# Patient Record
Sex: Female | Born: 1955 | Race: Black or African American | Hispanic: No | Smoking: Never smoker
Health system: Southern US, Community
[De-identification: ages and names within clinical notes are randomized; demographics above are authoritative.]

## PROBLEM LIST (undated history)

## (undated) DIAGNOSIS — I10 Essential (primary) hypertension: Secondary | ICD-10-CM

## (undated) DIAGNOSIS — K219 Gastro-esophageal reflux disease without esophagitis: Secondary | ICD-10-CM

## (undated) DIAGNOSIS — T7840XA Allergy, unspecified, initial encounter: Secondary | ICD-10-CM

## (undated) DIAGNOSIS — D649 Anemia, unspecified: Secondary | ICD-10-CM

## (undated) DIAGNOSIS — K56609 Unspecified intestinal obstruction, unspecified as to partial versus complete obstruction: Secondary | ICD-10-CM

## (undated) HISTORY — DX: Allergy, unspecified, initial encounter: T78.40XA

## (undated) HISTORY — DX: Anemia, unspecified: D64.9

## (undated) HISTORY — DX: Essential (primary) hypertension: I10

## (undated) HISTORY — PX: COLONOSCOPY: SHX174

## (undated) HISTORY — DX: Gastro-esophageal reflux disease without esophagitis: K21.9

---

## 1990-03-15 HISTORY — PX: HIATAL HERNIA REPAIR: SHX195

## 2000-05-19 ENCOUNTER — Other Ambulatory Visit: Admission: RE | Admit: 2000-05-19 | Discharge: 2000-05-19 | Payer: Self-pay | Admitting: Family Medicine

## 2001-06-14 ENCOUNTER — Other Ambulatory Visit: Admission: RE | Admit: 2001-06-14 | Discharge: 2001-06-14 | Payer: Self-pay | Admitting: Internal Medicine

## 2002-06-20 ENCOUNTER — Other Ambulatory Visit: Admission: RE | Admit: 2002-06-20 | Discharge: 2002-06-20 | Payer: Self-pay | Admitting: Internal Medicine

## 2002-07-03 ENCOUNTER — Encounter: Admission: RE | Admit: 2002-07-03 | Discharge: 2002-07-03 | Payer: Self-pay | Admitting: Family Medicine

## 2002-07-03 ENCOUNTER — Encounter: Payer: Self-pay | Admitting: Family Medicine

## 2003-07-15 ENCOUNTER — Other Ambulatory Visit: Admission: RE | Admit: 2003-07-15 | Discharge: 2003-07-15 | Payer: Self-pay | Admitting: Internal Medicine

## 2003-07-16 ENCOUNTER — Encounter: Admission: RE | Admit: 2003-07-16 | Discharge: 2003-07-16 | Payer: Self-pay | Admitting: Family Medicine

## 2004-05-28 ENCOUNTER — Ambulatory Visit: Payer: Self-pay | Admitting: Family Medicine

## 2004-06-08 ENCOUNTER — Ambulatory Visit: Payer: Self-pay | Admitting: Family Medicine

## 2004-07-15 ENCOUNTER — Ambulatory Visit: Payer: Self-pay | Admitting: Family Medicine

## 2004-08-27 ENCOUNTER — Ambulatory Visit (HOSPITAL_BASED_OUTPATIENT_CLINIC_OR_DEPARTMENT_OTHER): Admission: RE | Admit: 2004-08-27 | Discharge: 2004-08-27 | Payer: Self-pay | Admitting: Surgery

## 2004-08-27 ENCOUNTER — Encounter (INDEPENDENT_AMBULATORY_CARE_PROVIDER_SITE_OTHER): Payer: Self-pay | Admitting: *Deleted

## 2004-08-27 ENCOUNTER — Ambulatory Visit (HOSPITAL_COMMUNITY): Admission: RE | Admit: 2004-08-27 | Discharge: 2004-08-27 | Payer: Self-pay | Admitting: Surgery

## 2004-09-17 ENCOUNTER — Ambulatory Visit: Payer: Self-pay | Admitting: Family Medicine

## 2004-12-03 ENCOUNTER — Ambulatory Visit: Payer: Self-pay | Admitting: Family Medicine

## 2005-03-03 ENCOUNTER — Ambulatory Visit: Payer: Self-pay | Admitting: Family Medicine

## 2005-03-15 HISTORY — PX: CERVICAL FUSION: SHX112

## 2005-06-30 ENCOUNTER — Ambulatory Visit: Payer: Self-pay | Admitting: Family Medicine

## 2005-10-05 ENCOUNTER — Ambulatory Visit: Payer: Self-pay | Admitting: Family Medicine

## 2005-10-05 ENCOUNTER — Other Ambulatory Visit: Admission: RE | Admit: 2005-10-05 | Discharge: 2005-10-05 | Payer: Self-pay | Admitting: Family Medicine

## 2005-12-10 ENCOUNTER — Ambulatory Visit: Payer: Self-pay | Admitting: Family Medicine

## 2005-12-13 ENCOUNTER — Encounter: Admission: RE | Admit: 2005-12-13 | Discharge: 2005-12-13 | Payer: Self-pay | Admitting: Family Medicine

## 2005-12-19 ENCOUNTER — Encounter: Admission: RE | Admit: 2005-12-19 | Discharge: 2005-12-19 | Payer: Self-pay | Admitting: Internal Medicine

## 2005-12-20 ENCOUNTER — Observation Stay (HOSPITAL_COMMUNITY): Admission: EM | Admit: 2005-12-20 | Discharge: 2005-12-20 | Payer: Self-pay | Admitting: Emergency Medicine

## 2006-07-21 ENCOUNTER — Encounter: Payer: Self-pay | Admitting: Family Medicine

## 2006-07-21 ENCOUNTER — Ambulatory Visit: Payer: Self-pay | Admitting: Family Medicine

## 2006-07-21 DIAGNOSIS — M79609 Pain in unspecified limb: Secondary | ICD-10-CM

## 2006-07-21 DIAGNOSIS — I10 Essential (primary) hypertension: Secondary | ICD-10-CM | POA: Insufficient documentation

## 2006-07-21 DIAGNOSIS — D649 Anemia, unspecified: Secondary | ICD-10-CM | POA: Insufficient documentation

## 2006-08-17 ENCOUNTER — Ambulatory Visit: Payer: Self-pay | Admitting: Family Medicine

## 2006-08-17 DIAGNOSIS — J069 Acute upper respiratory infection, unspecified: Secondary | ICD-10-CM | POA: Insufficient documentation

## 2006-08-17 DIAGNOSIS — R42 Dizziness and giddiness: Secondary | ICD-10-CM | POA: Insufficient documentation

## 2006-08-25 ENCOUNTER — Encounter: Payer: Self-pay | Admitting: Family Medicine

## 2006-08-25 ENCOUNTER — Encounter (INDEPENDENT_AMBULATORY_CARE_PROVIDER_SITE_OTHER): Payer: Self-pay | Admitting: Internal Medicine

## 2006-09-03 ENCOUNTER — Encounter: Payer: Self-pay | Admitting: Family Medicine

## 2006-12-13 ENCOUNTER — Ambulatory Visit: Payer: Self-pay | Admitting: Family Medicine

## 2006-12-23 ENCOUNTER — Ambulatory Visit: Payer: Self-pay | Admitting: Family Medicine

## 2006-12-29 ENCOUNTER — Encounter (INDEPENDENT_AMBULATORY_CARE_PROVIDER_SITE_OTHER): Payer: Self-pay | Admitting: *Deleted

## 2006-12-29 LAB — CONVERTED CEMR LAB
AST: 19 units/L (ref 0–37)
Basophils Absolute: 0 10*3/uL (ref 0.0–0.1)
Basophils Relative: 1.1 % — ABNORMAL HIGH (ref 0.0–1.0)
Bilirubin, Direct: 0.1 mg/dL (ref 0.0–0.3)
CO2: 28 meq/L (ref 19–32)
Chloride: 110 meq/L (ref 96–112)
Cholesterol: 159 mg/dL (ref 0–200)
Creatinine, Ser: 0.8 mg/dL (ref 0.4–1.2)
Eosinophils Relative: 2.5 % (ref 0.0–5.0)
Glucose, Bld: 100 mg/dL — ABNORMAL HIGH (ref 70–99)
HCT: 35.4 % — ABNORMAL LOW (ref 36.0–46.0)
Hemoglobin: 11.9 g/dL — ABNORMAL LOW (ref 12.0–15.0)
LDL Cholesterol: 97 mg/dL (ref 0–99)
MCHC: 33.7 g/dL (ref 30.0–36.0)
Monocytes Absolute: 0.6 10*3/uL (ref 0.2–0.7)
Neutrophils Relative %: 39.7 % — ABNORMAL LOW (ref 43.0–77.0)
Potassium: 4.2 meq/L (ref 3.5–5.1)
RBC: 3.9 M/uL (ref 3.87–5.11)
RDW: 12.6 % (ref 11.5–14.6)
Sodium: 146 meq/L — ABNORMAL HIGH (ref 135–145)
TSH: 0.98 microintl units/mL (ref 0.35–5.50)
Total Bilirubin: 0.4 mg/dL (ref 0.3–1.2)
Total CHOL/HDL Ratio: 3
Total Protein: 7.5 g/dL (ref 6.0–8.3)
WBC: 4 10*3/uL — ABNORMAL LOW (ref 4.5–10.5)

## 2007-04-06 ENCOUNTER — Ambulatory Visit: Payer: Self-pay | Admitting: Family Medicine

## 2007-04-06 DIAGNOSIS — M722 Plantar fascial fibromatosis: Secondary | ICD-10-CM

## 2007-07-04 ENCOUNTER — Telehealth (INDEPENDENT_AMBULATORY_CARE_PROVIDER_SITE_OTHER): Payer: Self-pay | Admitting: *Deleted

## 2007-08-17 ENCOUNTER — Encounter: Payer: Self-pay | Admitting: Family Medicine

## 2007-08-18 ENCOUNTER — Encounter: Payer: Self-pay | Admitting: Family Medicine

## 2007-08-28 ENCOUNTER — Ambulatory Visit: Payer: Self-pay | Admitting: Family Medicine

## 2007-08-28 DIAGNOSIS — K219 Gastro-esophageal reflux disease without esophagitis: Secondary | ICD-10-CM | POA: Insufficient documentation

## 2007-08-30 ENCOUNTER — Encounter (INDEPENDENT_AMBULATORY_CARE_PROVIDER_SITE_OTHER): Payer: Self-pay | Admitting: *Deleted

## 2007-09-01 ENCOUNTER — Encounter: Payer: Self-pay | Admitting: Family Medicine

## 2007-09-22 ENCOUNTER — Ambulatory Visit: Payer: Self-pay | Admitting: Gastroenterology

## 2007-09-22 DIAGNOSIS — R079 Chest pain, unspecified: Secondary | ICD-10-CM | POA: Insufficient documentation

## 2007-09-25 ENCOUNTER — Telehealth: Payer: Self-pay | Admitting: Gastroenterology

## 2007-10-13 ENCOUNTER — Ambulatory Visit: Payer: Self-pay | Admitting: Gastroenterology

## 2007-10-20 ENCOUNTER — Telehealth: Payer: Self-pay | Admitting: Gastroenterology

## 2007-11-21 ENCOUNTER — Ambulatory Visit: Payer: Self-pay | Admitting: Gastroenterology

## 2007-12-04 ENCOUNTER — Telehealth: Payer: Self-pay | Admitting: Gastroenterology

## 2007-12-08 ENCOUNTER — Ambulatory Visit: Payer: Self-pay | Admitting: Gastroenterology

## 2007-12-12 ENCOUNTER — Ambulatory Visit: Payer: Self-pay | Admitting: Family Medicine

## 2007-12-12 DIAGNOSIS — D239 Other benign neoplasm of skin, unspecified: Secondary | ICD-10-CM | POA: Insufficient documentation

## 2007-12-25 ENCOUNTER — Ambulatory Visit: Payer: Self-pay | Admitting: Family Medicine

## 2007-12-25 ENCOUNTER — Encounter: Payer: Self-pay | Admitting: Family Medicine

## 2007-12-28 ENCOUNTER — Telehealth (INDEPENDENT_AMBULATORY_CARE_PROVIDER_SITE_OTHER): Payer: Self-pay | Admitting: *Deleted

## 2008-01-10 ENCOUNTER — Encounter: Payer: Self-pay | Admitting: Family Medicine

## 2008-08-30 ENCOUNTER — Ambulatory Visit: Payer: Self-pay | Admitting: Family Medicine

## 2008-08-30 DIAGNOSIS — N390 Urinary tract infection, site not specified: Secondary | ICD-10-CM

## 2008-08-30 LAB — CONVERTED CEMR LAB
Glucose, Urine, Semiquant: NEGATIVE
Nitrite: NEGATIVE
Specific Gravity, Urine: <1.005
pH: 6

## 2008-08-31 ENCOUNTER — Encounter: Payer: Self-pay | Admitting: Family Medicine

## 2008-09-03 ENCOUNTER — Telehealth (INDEPENDENT_AMBULATORY_CARE_PROVIDER_SITE_OTHER): Payer: Self-pay | Admitting: *Deleted

## 2008-09-09 ENCOUNTER — Ambulatory Visit: Payer: Self-pay | Admitting: Family Medicine

## 2008-09-09 DIAGNOSIS — N760 Acute vaginitis: Secondary | ICD-10-CM | POA: Insufficient documentation

## 2008-09-09 LAB — CONVERTED CEMR LAB
Ketones, urine, test strip: NEGATIVE
Nitrite: NEGATIVE
Urobilinogen, UA: 0.2
pH: 7.5

## 2008-09-10 ENCOUNTER — Encounter: Payer: Self-pay | Admitting: Family Medicine

## 2008-09-10 LAB — CONVERTED CEMR LAB: RBC / HPF: NONE SEEN (ref <3)

## 2008-09-27 ENCOUNTER — Encounter: Payer: Self-pay | Admitting: Family Medicine

## 2008-10-31 ENCOUNTER — Ambulatory Visit: Payer: Self-pay | Admitting: Family Medicine

## 2008-10-31 ENCOUNTER — Encounter: Payer: Self-pay | Admitting: Family Medicine

## 2008-10-31 DIAGNOSIS — L821 Other seborrheic keratosis: Secondary | ICD-10-CM | POA: Insufficient documentation

## 2008-11-06 ENCOUNTER — Encounter (INDEPENDENT_AMBULATORY_CARE_PROVIDER_SITE_OTHER): Payer: Self-pay | Admitting: *Deleted

## 2008-11-07 ENCOUNTER — Ambulatory Visit: Payer: Self-pay | Admitting: Family Medicine

## 2009-02-03 ENCOUNTER — Telehealth: Payer: Self-pay | Admitting: Gastroenterology

## 2009-02-03 ENCOUNTER — Encounter: Payer: Self-pay | Admitting: Gastroenterology

## 2009-02-12 ENCOUNTER — Ambulatory Visit: Payer: Self-pay | Admitting: Family Medicine

## 2009-02-17 ENCOUNTER — Telehealth (INDEPENDENT_AMBULATORY_CARE_PROVIDER_SITE_OTHER): Payer: Self-pay | Admitting: *Deleted

## 2009-02-24 ENCOUNTER — Ambulatory Visit: Payer: Self-pay | Admitting: Family Medicine

## 2009-02-26 ENCOUNTER — Ambulatory Visit: Payer: Self-pay | Admitting: Family Medicine

## 2009-02-28 ENCOUNTER — Encounter (INDEPENDENT_AMBULATORY_CARE_PROVIDER_SITE_OTHER): Payer: Self-pay | Admitting: *Deleted

## 2009-02-28 LAB — CONVERTED CEMR LAB
OCCULT 2: NEGATIVE
OCCULT 3: NEGATIVE

## 2009-03-05 ENCOUNTER — Encounter: Payer: Self-pay | Admitting: Family Medicine

## 2009-03-10 ENCOUNTER — Ambulatory Visit: Payer: Self-pay | Admitting: Gastroenterology

## 2009-03-10 DIAGNOSIS — F411 Generalized anxiety disorder: Secondary | ICD-10-CM | POA: Insufficient documentation

## 2009-05-12 ENCOUNTER — Ambulatory Visit: Payer: Self-pay | Admitting: Family Medicine

## 2009-05-12 DIAGNOSIS — K921 Melena: Secondary | ICD-10-CM

## 2009-05-13 ENCOUNTER — Encounter (INDEPENDENT_AMBULATORY_CARE_PROVIDER_SITE_OTHER): Payer: Self-pay | Admitting: *Deleted

## 2009-05-14 ENCOUNTER — Telehealth (INDEPENDENT_AMBULATORY_CARE_PROVIDER_SITE_OTHER): Payer: Self-pay | Admitting: *Deleted

## 2009-05-14 LAB — CONVERTED CEMR LAB
Basophils Relative: 0.7 % (ref 0.0–3.0)
Hemoglobin: 11.3 g/dL — ABNORMAL LOW (ref 12.0–15.0)
Lymphocytes Relative: 50.3 % — ABNORMAL HIGH (ref 12.0–46.0)
Monocytes Relative: 21.3 % — ABNORMAL HIGH (ref 3.0–12.0)
Neutro Abs: 0.8 10*3/uL — ABNORMAL LOW (ref 1.4–7.7)
RBC: 3.62 M/uL — ABNORMAL LOW (ref 3.87–5.11)

## 2009-05-16 ENCOUNTER — Ambulatory Visit: Payer: Self-pay | Admitting: Family Medicine

## 2009-05-19 ENCOUNTER — Telehealth (INDEPENDENT_AMBULATORY_CARE_PROVIDER_SITE_OTHER): Payer: Self-pay | Admitting: *Deleted

## 2009-05-19 LAB — CONVERTED CEMR LAB
Basophils Absolute: 0 10*3/uL (ref 0.0–0.1)
Basophils Relative: 1 % (ref 0–1)
Eosinophils Relative: 3 % (ref 0–5)
HCT: 33.8 % — ABNORMAL LOW (ref 36.0–46.0)
Hemoglobin: 11 g/dL — ABNORMAL LOW (ref 12.0–15.0)
MCHC: 32.5 g/dL (ref 30.0–36.0)
Monocytes Absolute: 0.6 10*3/uL (ref 0.1–1.0)
RDW: 13.8 % (ref 11.5–15.5)

## 2009-05-27 ENCOUNTER — Ambulatory Visit: Payer: Self-pay | Admitting: Gastroenterology

## 2009-05-27 DIAGNOSIS — Z860100 Personal history of colon polyps, unspecified: Secondary | ICD-10-CM | POA: Insufficient documentation

## 2009-05-27 DIAGNOSIS — E538 Deficiency of other specified B group vitamins: Secondary | ICD-10-CM | POA: Insufficient documentation

## 2009-05-27 DIAGNOSIS — K222 Esophageal obstruction: Secondary | ICD-10-CM

## 2009-06-06 ENCOUNTER — Ambulatory Visit: Payer: Self-pay | Admitting: Gastroenterology

## 2009-06-16 ENCOUNTER — Ambulatory Visit: Payer: Self-pay | Admitting: Family Medicine

## 2009-06-16 DIAGNOSIS — M549 Dorsalgia, unspecified: Secondary | ICD-10-CM | POA: Insufficient documentation

## 2009-06-16 DIAGNOSIS — Z87448 Personal history of other diseases of urinary system: Secondary | ICD-10-CM

## 2009-06-16 LAB — CONVERTED CEMR LAB
Glucose, Urine, Semiquant: NEGATIVE
Nitrite: NEGATIVE
Protein, U semiquant: NEGATIVE
WBC Urine, dipstick: NEGATIVE
pH: 7

## 2009-06-17 ENCOUNTER — Encounter: Payer: Self-pay | Admitting: Family Medicine

## 2009-07-03 ENCOUNTER — Ambulatory Visit: Payer: Self-pay | Admitting: Family Medicine

## 2009-07-03 LAB — CONVERTED CEMR LAB
Basophils Absolute: 0 10*3/uL (ref 0.0–0.1)
Ferritin: 30 ng/mL (ref 10–291)
Hemoglobin: 11.4 g/dL — ABNORMAL LOW (ref 12.0–15.0)
Lymphocytes Relative: 38 % (ref 12–46)
Monocytes Absolute: 0.5 10*3/uL (ref 0.1–1.0)
Neutro Abs: 1.7 10*3/uL (ref 1.7–7.7)
Neutrophils Relative %: 43 % (ref 43–77)
RDW: 14 % (ref 11.5–15.5)
Saturation Ratios: 19 % — ABNORMAL LOW (ref 20–55)
TIBC: 345 ug/dL (ref 250–470)
UIBC: 280 ug/dL

## 2009-07-08 ENCOUNTER — Telehealth: Payer: Self-pay | Admitting: Family Medicine

## 2009-07-11 ENCOUNTER — Telehealth: Payer: Self-pay | Admitting: Family Medicine

## 2009-07-14 ENCOUNTER — Ambulatory Visit: Payer: Self-pay | Admitting: Family Medicine

## 2009-07-14 LAB — CONVERTED CEMR LAB
Glucose, Urine, Semiquant: NEGATIVE
Specific Gravity, Urine: 1.015
WBC Urine, dipstick: NEGATIVE
pH: 6.5

## 2009-07-15 ENCOUNTER — Encounter: Payer: Self-pay | Admitting: Family Medicine

## 2009-07-21 ENCOUNTER — Ambulatory Visit: Payer: Self-pay | Admitting: Family Medicine

## 2009-07-21 DIAGNOSIS — J019 Acute sinusitis, unspecified: Secondary | ICD-10-CM

## 2009-07-24 ENCOUNTER — Encounter (INDEPENDENT_AMBULATORY_CARE_PROVIDER_SITE_OTHER): Payer: Self-pay | Admitting: *Deleted

## 2009-07-24 ENCOUNTER — Telehealth: Payer: Self-pay | Admitting: Family Medicine

## 2009-07-28 ENCOUNTER — Encounter: Payer: Self-pay | Admitting: Family Medicine

## 2009-08-04 ENCOUNTER — Telehealth: Payer: Self-pay | Admitting: Gastroenterology

## 2009-08-05 ENCOUNTER — Telehealth (INDEPENDENT_AMBULATORY_CARE_PROVIDER_SITE_OTHER): Payer: Self-pay | Admitting: *Deleted

## 2009-08-06 ENCOUNTER — Telehealth: Payer: Self-pay | Admitting: Gastroenterology

## 2009-09-24 ENCOUNTER — Encounter: Payer: Self-pay | Admitting: Family Medicine

## 2009-10-14 ENCOUNTER — Encounter: Payer: Self-pay | Admitting: Family Medicine

## 2009-10-16 ENCOUNTER — Encounter: Payer: Self-pay | Admitting: Family Medicine

## 2010-03-24 ENCOUNTER — Ambulatory Visit
Admission: RE | Admit: 2010-03-24 | Discharge: 2010-03-24 | Payer: Self-pay | Source: Home / Self Care | Attending: Family Medicine | Admitting: Family Medicine

## 2010-03-24 ENCOUNTER — Encounter: Payer: Self-pay | Admitting: Family Medicine

## 2010-03-25 ENCOUNTER — Other Ambulatory Visit: Payer: Self-pay | Admitting: Family Medicine

## 2010-03-25 ENCOUNTER — Ambulatory Visit
Admission: RE | Admit: 2010-03-25 | Discharge: 2010-03-25 | Payer: Self-pay | Source: Home / Self Care | Attending: Family Medicine | Admitting: Family Medicine

## 2010-03-25 LAB — CBC WITH DIFFERENTIAL/PLATELET
Basophils Absolute: 0 10*3/uL (ref 0.0–0.1)
Basophils Relative: 0.3 % (ref 0.0–3.0)
Eosinophils Absolute: 0.1 10*3/uL (ref 0.0–0.7)
Eosinophils Relative: 2.8 % (ref 0.0–5.0)
HCT: 35 % — ABNORMAL LOW (ref 36.0–46.0)
Hemoglobin: 11.8 g/dL — ABNORMAL LOW (ref 12.0–15.0)
Lymphocytes Relative: 46.6 % — ABNORMAL HIGH (ref 12.0–46.0)
Lymphs Abs: 1.8 10*3/uL (ref 0.7–4.0)
MCHC: 33.6 g/dL (ref 30.0–36.0)
MCV: 91.9 fl (ref 78.0–100.0)
Monocytes Absolute: 0.6 10*3/uL (ref 0.1–1.0)
Monocytes Relative: 15.8 % — ABNORMAL HIGH (ref 3.0–12.0)
Neutro Abs: 1.3 10*3/uL — ABNORMAL LOW (ref 1.4–7.7)
Neutrophils Relative %: 34.5 % — ABNORMAL LOW (ref 43.0–77.0)
Platelets: 170 10*3/uL (ref 150.0–400.0)
RBC: 3.81 Mil/uL — ABNORMAL LOW (ref 3.87–5.11)
RDW: 13.9 % (ref 11.5–14.6)
WBC: 3.8 10*3/uL — ABNORMAL LOW (ref 4.5–10.5)

## 2010-03-25 LAB — BASIC METABOLIC PANEL
BUN: 18 mg/dL (ref 6–23)
CO2: 30 mEq/L (ref 19–32)
Calcium: 10.3 mg/dL (ref 8.4–10.5)
Chloride: 101 mEq/L (ref 96–112)
Creatinine, Ser: 0.9 mg/dL (ref 0.4–1.2)
GFR: 80.32 mL/min (ref >60.00)
Glucose, Bld: 89 mg/dL (ref 70–99)
Potassium: 4 mEq/L (ref 3.5–5.1)
Sodium: 139 mEq/L (ref 135–145)

## 2010-03-25 LAB — HEPATIC FUNCTION PANEL
ALT: 11 U/L (ref 0–35)
AST: 21 U/L (ref 0–37)
Albumin: 4 g/dL (ref 3.5–5.2)
Alkaline Phosphatase: 67 U/L (ref 39–117)
Bilirubin, Direct: 0.1 mg/dL (ref 0.0–0.3)
Total Bilirubin: 0.5 mg/dL (ref 0.3–1.2)
Total Protein: 7.6 g/dL (ref 6.0–8.3)

## 2010-03-25 LAB — LIPID PANEL
Cholesterol: 161 mg/dL (ref 0–200)
HDL: 54.2 mg/dL (ref >39.00)
LDL Cholesterol: 98 mg/dL (ref 0–99)
Total CHOL/HDL Ratio: 3
Triglycerides: 44 mg/dL (ref 0.0–149.0)
VLDL: 8.8 mg/dL (ref 0.0–40.0)

## 2010-03-25 LAB — CONVERTED CEMR LAB
Ketones, urine, test strip: NEGATIVE
Nitrite: NEGATIVE
Protein, U semiquant: NEGATIVE
Urobilinogen, UA: 0.2

## 2010-03-25 LAB — TSH: TSH: 1.19 u[IU]/mL (ref 0.35–5.50)

## 2010-03-31 ENCOUNTER — Telehealth (INDEPENDENT_AMBULATORY_CARE_PROVIDER_SITE_OTHER): Payer: Self-pay | Admitting: *Deleted

## 2010-04-07 ENCOUNTER — Ambulatory Visit
Admission: RE | Admit: 2010-04-07 | Discharge: 2010-04-07 | Payer: Self-pay | Source: Home / Self Care | Attending: Gastroenterology | Admitting: Gastroenterology

## 2010-04-09 ENCOUNTER — Ambulatory Visit
Admission: RE | Admit: 2010-04-09 | Discharge: 2010-04-09 | Payer: Self-pay | Source: Home / Self Care | Attending: Family Medicine | Admitting: Family Medicine

## 2010-04-12 LAB — CONVERTED CEMR LAB
ALT: 13 units/L (ref 0–35)
AST: 21 units/L (ref 0–37)
Albumin: 4 g/dL (ref 3.5–5.2)
Alkaline Phosphatase: 60 units/L (ref 39–117)
Basophils Absolute: 0 10*3/uL (ref 0.0–0.1)
Basophils Relative: 0.4 % (ref 0.0–3.0)
Bilirubin Urine: NEGATIVE
Calcium: 9.6 mg/dL (ref 8.4–10.5)
Eosinophils Relative: 1.7 % (ref 0.0–5.0)
Folate: >20 ng/mL
GFR calc non Af Amer: 97.18 mL/min (ref >60)
Glucose, Urine, Semiquant: NEGATIVE
HDL: 60.3 mg/dL (ref >39.00)
Hemoglobin: 11.5 g/dL — ABNORMAL LOW (ref 12.0–15.0)
Ketones, urine, test strip: NEGATIVE
Ketones, urine, test strip: NEGATIVE
LDL Cholesterol: 86 mg/dL (ref 0–99)
Lymphocytes Relative: 32.5 % (ref 12.0–46.0)
Monocytes Relative: 13.5 % — ABNORMAL HIGH (ref 3.0–12.0)
Neutro Abs: 2.4 10*3/uL (ref 1.4–7.7)
Nitrite: NEGATIVE
Pap Smear: NORMAL
Potassium: 3.6 meq/L (ref 3.5–5.1)
RBC: 3.6 M/uL — ABNORMAL LOW (ref 3.87–5.11)
RDW: 12.8 % (ref 11.5–14.6)
Sodium: 138 meq/L (ref 135–145)
Specific Gravity, Urine: <1.005
Total CHOL/HDL Ratio: 3
Urobilinogen, UA: 0.2
VLDL: 7.4 mg/dL (ref 0.0–40.0)
Vitamin B-12: 619 pg/mL (ref 211–911)
WBC: 4.6 10*3/uL (ref 4.5–10.5)

## 2010-04-14 NOTE — Assessment & Plan Note (Signed)
Summary: BLD IN STOOL/RH....   Vital Signs:  Patient profile:   55 year old female Weight:      133 pounds Temp:     98.2 degrees F oral Pulse rate:   72 / minute Pulse rhythm:   regular BP sitting:   110 / 80  (left arm) Cuff size:   regular  Vitals Entered By: Army Fossa CMA (May 12, 2009 2:54 PM) CC: Pt c/o blood in stool last night(first time) complains of rectal area being sore.   CC:  Pt c/o blood in stool last night(first time) complains of rectal area being sore.Marland Kitchen  History of Present Illness: Pt here c/o blood in stool last night.  Pt says she was constipated and straining with BM.  No blood today.  j  Current Medications (verified): 1)  Orthotics .... Re Plantar Fascitis===  Pt Will Benefit From Orthotics For This Problem 2)  Lisinopril-Hydrochlorothiazide 20-25 Mg Tabs (Lisinopril-Hydrochlorothiazide) .Marland Kitchen.. 1 By Mouth Once Daily 3)  Dexilant 60 Mg Cpdr (Dexlansoprazole) .Marland Kitchen.. 1 By Mouth Once Daily 4)  Alprazolam 0.5 Mg Tabs (Alprazolam) .... Take One Tab Q.6 H. P.r.n. Anxiety  Allergies (verified): No Known Drug Allergies  Past History:  Past medical, surgical, family and social histories (including risk factors) reviewed for relevance to current acute and chronic problems.  Past Medical History: Reviewed history from 08/28/2007 and no changes required. Hypertension Anemia-NOS GERD  Past Surgical History: Reviewed history from 07/21/2006 and no changes required. Cervical fusion (12/20/2005) Inguinal herniorrhaphy, 1992 mole removal FAce/ neck 2007 L breast cyst 1996  Family History: Reviewed history from 03/10/2009 and no changes required. Family History of CAD Female 1st degree relative <50 Family History Depression Family History Hypertension F- alz dem No FH of Colon Cancer:  Social History: Reviewed history from 11/21/2007 and no changes required. 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 Smoked Drug use-no Regular  exercise-yes  Review of Systems      See HPI  Physical Exam  General:  Well-developed,well-nourished,in no acute distress; alert,appropriate and cooperative throughout examination Abdomen:  Bowel sounds positive,abdomen soft and non-tender without masses, organomegaly or hernias noted. Rectal:  normal sphincter tone, no masses, and stool positive for occult blood.   small ext hemorrhoids---not bleeding Psych:  Oriented X3 and normally interactive.     Impression & Recommendations:  Problem # 1:  GUAIAC POSITIVE STOOL (ICD-578.1) con't dexilant Orders: Gastroenterology Referral (GI) Venipuncture ([address]) TLB-CBC Platelet - w/Differential (85025-CBCD)  Complete Medication List: 1)  Orthotics  .... Re plantar fascitis===  pt will benefit from orthotics for this problem 2)  Lisinopril-hydrochlorothiazide 20-25 Mg Tabs (Lisinopril-hydrochlorothiazide) .Marland Kitchen.. 1 by mouth once daily 3)  Dexilant 60 Mg Cpdr (Dexlansoprazole) .Marland Kitchen.. 1 by mouth once daily 4)  Alprazolam 0.5 Mg Tabs (Alprazolam) .... Take one tab q.6 h. p.r.n. anxiety

## 2010-04-14 NOTE — Assessment & Plan Note (Signed)
Summary: lower back pain//lch   Vital Signs:  Patient profile:   55 year old female Weight:      135 pounds Temp:     98.8 degrees F oral Pulse rate:   76 / minute Pulse rhythm:   regular BP sitting:   120 / 80  (left arm) Cuff size:   regular  Vitals Entered By: Army Fossa CMA (June 16, 2009 12:53 PM) CC: Pt here c/o lower back pain x 2 weeks. No problems urinating., Back Pain   History of Present Illness:       This is a 55 year old woman who presents with Back Pain.  The symptoms began 2 weeks ago.  The patient denies fever, chills, weakness, loss of sensation, fecal incontinence, urinary incontinence, urinary retention, dysuria, rest pain, inability to work, and inability to care for self.  The pain is located in the right low back and left low back.  Pt states sitting for long periods aggravate it.  Pt is expecting period soon.  Pt does now remember bending over to pick something up in Bedroom and she twisted funny---pain started after that.     Allergies (verified): No Known Drug Allergies  Physical Exam  General:  Well-developed,well-nourished,in no acute distress; alert,appropriate and cooperative throughout examination Abdomen:  Bowel sounds positive,abdomen soft and non-tender without masses, organomegaly or hernias noted. Msk:  normal ROM, no joint tenderness, no joint swelling, no joint warmth, no redness over joints, no joint deformities, no joint instability, and no crepitation.   Extremities:  No clubbing, cyanosis, edema, or deformity noted with normal full range of motion of all joints.   Neurologic:  strength normal in all extremities, gait normal, and DTRs symmetrical and normal.   Psych:  Oriented X3 and normally interactive.     Impression & Recommendations:  Problem # 1:  BACK PAIN (ICD-724.5)  Orders: UA Dipstick w/o Micro (manual) ([address])  Her updated medication list for this problem includes:    Mobic 15 Mg Tabs (Meloxicam) .Marland Kitchen... 1/2 -1 by  mouth once daily    Flexeril 10 Mg Tabs (Cyclobenzaprine hcl) .Marland Kitchen... 1/2 -1 by mouth three times a day as needed  Discussed use of moist heat or ice, modified activities, medications, and stretching/strengthening exercises. Back care instructions given. To be seen in 2 weeks if no improvement; sooner if worsening of symptoms.   Problem # 2:  HEMATURIA, HX OF (ICD-V13.09)  Orders: T-Culture, Urine ([address]) UA Dipstick w/o Micro (manual) (9[address]78)  Complete Medication List: 1)  Orthotics  .... Re plantar fascitis===  pt will benefit from orthotics for this problem 2)  Lisinopril-hydrochlorothiazide 20-25 Mg Tabs (Lisinopril-hydrochlorothiazide) .Marland Kitchen.. 1 by mouth once daily 3)  Nexium 40 Mg Cpdr (Esomeprazole magnesium) .Marland Kitchen.. 1 by mouth daily 4)  Ferrous Sulfate 325 (65 Fe) Mg Tabs (Ferrous sulfate) .... One tablet by mouth once daily 5)  Mobic 15 Mg Tabs (Meloxicam) .... 1/2 -1 by mouth once daily 6)  Flexeril 10 Mg Tabs (Cyclobenzaprine hcl) .... 1/2 -1 by mouth three times a day as needed  Patient Instructions: 1)  recheck urine in 10-14 days  Prescriptions: FLEXERIL 10 MG TABS (CYCLOBENZAPRINE HCL) 1/2 -1 by mouth three times a day as needed  #30 x 0   Entered and Authorized by:   Loreen Freud DO   Signed by:   Loreen Freud DO on 06/16/2009   Method used:   Electronically to        [facility]  [address] [facility] [address] #  [id number]* (retail)       [address]       [place], [place]  [address]       Ph: [phone]       Fax: [phone]   RxID:   [id number] MOBIC 15 MG TABS (MELOXICAM) 1/2 -1 by mouth once daily  #30 x 0   Entered and Authorized by:   Loreen Freud DO   Signed by:   Loreen Freud DO on 06/16/2009   Method used:   Electronically to        [facility] # [phone]* (retail)       [address]       [place], [place]  [address]       Ph: [phone]       Fax: [phone]   RxID:   [id number]   Laboratory Results   Urine  Tests    Routine Urinalysis   Color: yellow Appearance: Clear Glucose: negative   (Normal Range: Negative) Bilirubin: negative   (Normal Range: Negative) Ketone: negative   (Normal Range: Negative) Spec. Gravity: 1.015   (Normal Range: 1.003-1.035) Blood: small   (Normal Range: Negative) pH: 7.0   (Normal Range: 5.0-8.0) Protein: negative   (Normal Range: Negative) Urobilinogen: 0.2   (Normal Range: 0-1) Nitrite: negative   (Normal Range: Negative) Leukocyte Esterace: negative   (Normal Range: Negative)    Comments: Army Fossa CMA  June 16, 2009 1:01 PM

## 2010-04-14 NOTE — Progress Notes (Signed)
Summary: Lab Results   Phone Note Outgoing Call   Call placed by: Army Fossa CMA,  May 14, 2009 1:48 PM Summary of Call: Regarding lab Results, LMTCB:  WBC low----  repeat this week 288.8  cbcd--- if still abnormal will refer to heme Signed by Loreen Freud DO on 05/13/2009 at 10:37 PM   Follow-up for Phone Call        left message to call  office.............Marland KitchenFelecia Deloach CMA  May 16, 2009 10:53 AM   Additional Follow-up for Phone Call Additional follow up Details #1::        PT CALL BACK AND MADE AN APPT FOR 05/16/09 AT 4 PM FOR HER LABS Additional Follow-up by: Freddy Jaksch,  May 16, 2009 1:37 PM

## 2010-04-14 NOTE — Progress Notes (Signed)
Summary: Possible Reaction to med?   Phone Note Call from Patient Call back at [phone]   Caller: Patient Summary of Call: Pt states that since starting the Cipro yesterday she is having dizziness and tingling in her hands. She states that it comes and goes. Pt denies any SOB, no swelling. No redness in her hands. She states that she took one dose yesterday, has not taken any today but still experiencing the symptoms not as bad but still there. I asked pt if she could go to an UC she states she is in [place] and does not know where any are at, and would prefer if I could ask a doctor if there were any other options first. Please advise. Army Fossa CMA  July 11, 2009 9:05 AM   Follow-up for Phone Call        dizziness is a possible side effect in <10% of people but the numbness and tingling of hands is not a usual side effect of medication.  given that pt is having multiple sxs and has not been seen by a doctor for the ?UTI and now having possible complications from the med, needs to be seen.  should stop Cipro but no additional meds will be called in at this time. Follow-up by: Neena Rhymes MD,  July 11, 2009 9:29 AM  Additional Follow-up for Phone Call Additional follow up Details #1::        I informed pt of all of the above, [place] was so she may just wait until she gets back. I stressed to her that she should call around and find an UC to be seen at. She states that she will "probably" call around, she also made an appt to see Dr.Lowne on Monday. Army Fossa CMA  July 11, 2009 9:47 AM

## 2010-04-14 NOTE — Letter (Signed)
Summary: Primary Care Consult Scheduled Letter  [facility]  [address] [place], [place] [address]   Phone: [phone]  Fax: [phone]      05/13/2009 MRN: [id number]  Angela Thornton [address] [place], [place]  [address]    Dear [name],    We have scheduled an appointment for you.  At the recommendation of Dr. Loreen Freud, we have scheduled you a consult with Dr. Arlyce Dice of [facility] on 05-27-2009 at 9:15am.  Their address is [address] floor, [place] [address]. The office phone number is [phone].  If this appointment day and time is not convenient for you, please feel free to call the office of the doctor you are being referred to at the number listed above and reschedule the appointment.    It is important for you to keep your scheduled appointments. We are here to make sure you are given good patient care.   Thank you,    Renee, Patient Care Coordinator [facility]    **IF YOU ARE UNABLE TO KEEP THIS APPOINTMENT, OR NEED TO RESCHEDULE, PLEASE GIVE DR. KAPLAN'S OFFICE A 24 HOUR NOTICE TO AVOID A $50 FEE**

## 2010-04-14 NOTE — Progress Notes (Signed)
Summary: lab result(lmom 4/27)  Phone Note Outgoing Call   Summary of Call: left message to call office............Marland KitchenFelecia Deloach CMA  July 08, 2009 9:06 AM   contaminated----if symptomatic---repeat  left message to call  office.............Marland KitchenFelecia Deloach CMA  July 09, 2009 8:40 AM   Follow-up for Phone Call        Pt still c/o burning,frequency and blood in urine. pt has tried some AZO with some relief. pt is currently out of town and would like to know if a med can be rx.. pls advise...................Marland KitchenFelecia Deloach CMA  July 09, 2009 10:21 AM   Additional Follow-up for Phone Call Additional follow up Details #1::        cipro 500 two times a day x5 days Additional Follow-up by: Loreen Freud DO,  July 09, 2009 11:41 AM    Additional Follow-up for Phone Call Additional follow up Details #2::    Left message for pt to call back. Army Fossa CMA  July 09, 2009 12:59 PM   Additional Follow-up for Phone Call Additional follow up Details #3:: Details for Additional Follow-up Action Taken: Pt is aware, I called in medications [facility] in [place]. Army Fossa CMA  July 09, 2009 1:18 PM   New/Updated Medications: CIPRO 500 MG TABS (CIPROFLOXACIN HCL) 1 by mouth two times a day x 5 days. Prescriptions: CIPRO 500 MG TABS (CIPROFLOXACIN HCL) 1 by mouth two times a day x 5 days.  #10 x 0   Entered by:   Army Fossa CMA   Authorized by:   Loreen Freud DO   Signed by:   Army Fossa CMA on 07/09/2009   Method used:   Telephoned to ...       [facility]  [address] # [phone]* (retail)       [phone]       [place], [place]  [address]       Ph: [phone]       Fax: [phone]   RxID:   [id number]

## 2010-04-14 NOTE — Progress Notes (Signed)
Summary: Nesium refill  Phone Note Call from Patient Call back at Work Phone [phone]   Call For: Dr Arlyce Dice Reason for Call: Refill Medication Summary of Call: Is all out of her Nexium. Dr told her to call us when she runs out and we would refill. [facility] [address] in [place] Initial call taken by: Leanor Kail [facility],  Aug 04, 2009 8:13 AM  Follow-up for Phone Call        Called pt to inform med was sent Follow-up by: Merri Ray CMA Duncan Dull),  Aug 04, 2009 8:29 AM    Prescriptions: NEXIUM 40 MG CPDR (ESOMEPRAZOLE MAGNESIUM) 1 by mouth daily  #30 x 6   Entered by:   Merri Ray CMA (AAMA)   Authorized by:   Louis Meckel MD   Signed by:   Merri Ray CMA (AAMA) on 08/04/2009   Method used:   Electronically to        [facility] # [phone]* (retail)       [address]       [place], [place]  [address]       Ph: [phone]       Fax: [phone]   RxID:   [id number]

## 2010-04-14 NOTE — Assessment & Plan Note (Signed)
Summary: POS STOOLS/YF   History of Present Illness Visit Type: consult  Primary GI MD: Melvia Heaps MD [facility] Primary Provider: Loreen Freud DO Requesting Provider: Loreen Freud DO Chief Complaint: Heme positive stools History of Present Illness:   Angela Thornton has returned for evaluation of heme-positive stools.  Approximately 2 weeks ago she passed a small amount of bright red blood per rectum after having a hard bowel movement.  She was seen soon thereafter in Dr. Ernst Spell office where a Hemoccult positive stool was noted.  She has had no recurrence of bleeding.  Colonoscopy in September, 2009 was pertinent for a 4 mm polyp that was removed but could not be retrieved.  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  The patient also has a history of esophageal stricture which has been dilated in the past.  She denies dysphagia since her last dilatation.  Hemoglobin on May 16, 2009 was 11.0.  It has been in the 11 range.  She takes supplementary iron.   GI Review of Systems      Denies abdominal pain, acid reflux, belching, bloating, chest pain, dysphagia with liquids, dysphagia with solids, heartburn, loss of appetite, nausea, vomiting, vomiting blood, weight loss, and  weight gain.      Reports heme positive stool and  rectal bleeding.     Denies anal fissure, black tarry stools, change in bowel habit, constipation, diarrhea, diverticulosis, fecal incontinence, hemorrhoids, irritable bowel syndrome, jaundice, light color stool, liver problems, and  rectal pain.    Current Medications (verified): 1)  Orthotics .... Re Plantar Fascitis===  Pt Will Benefit From Orthotics For This Problem 2)  Lisinopril-Hydrochlorothiazide 20-25 Mg Tabs (Lisinopril-Hydrochlorothiazide) .Marland Kitchen.. 1 By Mouth Once Daily 3)  Dexilant 60 Mg Cpdr (Dexlansoprazole) .Marland Kitchen.. 1 By Mouth Once Daily 4)  Ferrous Sulfate 325 (65 Fe) Mg Tabs (Ferrous Sulfate) .... One Tablet By Mouth Once Daily  Allergies (verified): No Known Drug  Allergies  Past History:  Past Medical History: Reviewed history from 08/28/2007 and no changes required. Hypertension Anemia-NOS GERD  Past Surgical History: Reviewed history from 07/21/2006 and no changes required. Cervical fusion (12/20/2005) Inguinal herniorrhaphy, 1992 mole removal FAce/ neck 2007 L breast cyst 1996  Family History: Reviewed history from 03/10/2009 and no changes required. Family History of CAD Female 1st degree relative <50 Family History Depression Family History Hypertension F- alz dem No FH of Colon Cancer:  Social History: Reviewed history from 11/21/2007 and no changes required. 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 Smoked Drug use-no Regular exercise-yes  Review of Systems  The patient denies allergy/sinus, anemia, anxiety-new, arthritis/joint pain, back pain, blood in urine, breast changes/lumps, change in vision, confusion, cough, coughing up blood, depression-new, fainting, fatigue, fever, headaches-new, hearing problems, heart murmur, heart rhythm changes, itching, menstrual pain, muscle pains/cramps, night sweats, nosebleeds, pregnancy symptoms, shortness of breath, skin rash, sleeping problems, sore throat, swelling of feet/legs, swollen lymph glands, thirst - excessive , urination - excessive , urination changes/pain, urine leakage, vision changes, and voice change.    Vital Signs:  Patient profile:   55 year old female Height:      61 inches Weight:      131 pounds BMI:     24.84 BSA:     1.58 Pulse rate:   72 / minute Pulse rhythm:   regular BP sitting:   110 / 76  (left arm) Cuff size:   regular  Vitals Entered By: Ok Anis CMA (May 27, 2009 2:50 PM)  Physical Exam  Additional Exam:  She is a well-developed well-nourished female  skin: anicteric HEENT: normocephalic; PEERLA; no nasal or pharyngeal abnormalities neck: supple nodes: no cervical lymphadenopathy chest: clear to ausculatation and  percussion heart: no murmurs, gallops, or rubs abd: soft, nontender; BS normoactive; no abdominal masses, tenderness, organomegaly rectal: no masses ext: no cynanosis, clubbing, edema skeletal: no deformities neuro: oriented x 3; no focal abnormalities    Impression & Recommendations:  Problem # 1:  GUAIAC POSITIVE STOOL (ICD-578.1) This is likely is related to distal colonic bleeding.  In the setting of passing  a hard stool hemorrhoids bleeding is likely.  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  Patient Instructions: 1)  You will go to the lab to pick up your hemoccult test 2)  The medication list was reviewed and reconciled.  All changed / newly prescribed medications were explained.  A complete medication list was provided to the patient / caregiver.

## 2010-04-14 NOTE — Assessment & Plan Note (Signed)
Summary: follow up/drb   Vital Signs:  Patient profile:   55 year old female Weight:      135.25 pounds Pulse rate:   78 / minute Pulse rhythm:   regular BP sitting:   122 / 80  (left arm) Cuff size:   regular  Vitals Entered By: Army Fossa CMA (Jul 14, 2009 4:01 PM) CC: Pt here to follow up- was seen at the hospital in [place] for a UTI.  Feeling much better!    History of Present Illness: Pt here f/u ER for UTI in [place].  We don't have records yet.  Pt is on macrobid and pyridium.  Symptoms are better.   Current Medications (verified): 1)  Orthotics .... Re Plantar Fascitis===  Pt Will Benefit From Orthotics For This Problem 2)  Lisinopril-Hydrochlorothiazide 20-25 Mg Tabs (Lisinopril-Hydrochlorothiazide) .Marland Kitchen.. 1 By Mouth Once Daily 3)  Nexium 40 Mg Cpdr (Esomeprazole Magnesium) .Marland Kitchen.. 1 By Mouth Daily 4)  Ferrous Sulfate 325 (65 Fe) Mg Tabs (Ferrous Sulfate) .... One Tablet By Mouth Once Daily 5)  Mobic 15 Mg Tabs (Meloxicam) .... 1/2 -1 By Mouth Once Daily 6)  Flexeril 10 Mg Tabs (Cyclobenzaprine Hcl) .... 1/2 -1 By Mouth Three Times A Day As Needed 7)  Cipro 500 Mg Tabs (Ciprofloxacin Hcl) .Marland Kitchen.. 1 By Mouth Two Times A Day X 5 Days. 8)  Macrobid 100 Mg Caps (Nitrofurantoin Monohyd Macro) .... Take 1 By Mouth Qid. 9)  Pyridium 200 Mg Tabs (Phenazopyridine Hcl) .... Take 1 Tab By Mouth Three Times A Day After Meals.  Allergies (verified): 1)  ! Cipro  Past History:  Past medical, surgical, family and social histories (including risk factors) reviewed for relevance to current acute and chronic problems.  Past Medical History: Reviewed history from 08/28/2007 and no changes required. Hypertension Anemia-NOS GERD  Past Surgical History: Reviewed history from 07/21/2006 and no changes required. Cervical fusion (12/20/2005) Inguinal herniorrhaphy, 1992 mole removal FAce/ neck 2007 L breast cyst 1996  Family History: Reviewed history from 03/10/2009 and no changes  required. Family History of CAD Female 1st degree relative <50 Family History Depression Family History Hypertension F- alz dem No FH of Colon Cancer:  Social History: Reviewed history from 11/21/2007 and no changes required. 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     Impression & Recommendations:  Problem # 1:  HEMATURIA, HX OF (ICD-V13.09)  Orders: Urology Referral (Urology)  Problem # 2:  UTI (ICD-599.0) Assessment: Improved  Her updated medication list for this problem includes:    Cipro 500 Mg Tabs (Ciprofloxacin hcl) .Marland Kitchen... 1 by mouth two times a day x 5 days.    Macrobid 100 Mg Caps (Nitrofurantoin monohyd macro) .Marland Kitchen... Take 1 by mouth qid.    Pyridium 200 Mg Tabs (Phenazopyridine hcl) .Marland Kitchen... Take 1 tab by mouth three times a day after meals.  Orders: T-Culture, Urine ([id number]) UA Dipstick w/o Micro (manual) (09811)  Encouraged to push clear liquids, get enough rest, and take acetaminophen as needed. To be seen in 10 days if no improvement, sooner if worse.  Complete Medication List: 1)  Orthotics  .... Re plantar fascitis===  pt will benefit from orthotics for this problem 2)  Lisinopril-hydrochlorothiazide 20-25 Mg Tabs (Lisinopril-hydrochlorothiazide) .Marland Kitchen.. 1 by mouth once daily 3)  Nexium  40 Mg Cpdr (Esomeprazole magnesium) .Marland Kitchen.. 1 by mouth daily 4)  Ferrous Sulfate 325 (65 Fe) Mg Tabs (Ferrous sulfate) .... One tablet by mouth once daily 5)  Mobic 15 Mg Tabs (Meloxicam) .... 1/2 -1 by mouth once daily 6)  Flexeril 10 Mg Tabs (Cyclobenzaprine hcl) .... 1/2 -1 by mouth three times a day as needed 7)  Cipro 500 Mg Tabs (Ciprofloxacin hcl) .Marland Kitchen.. 1 by mouth two times a day x 5 days. 8)  Macrobid 100  Mg Caps (Nitrofurantoin monohyd macro) .... Take 1 by mouth qid. 9)  Pyridium 200 Mg Tabs (Phenazopyridine hcl) .... Take 1 tab by mouth three times a day after meals.  Laboratory Results   Urine Tests    Routine Urinalysis   Color: yellow Appearance: Hazy Glucose: negative   (Normal Range: Negative) Bilirubin: negative   (Normal Range: Negative) Ketone: negative   (Normal Range: Negative) Spec. Gravity: 1.015   (Normal Range: 1.003-1.035) Blood: small   (Normal Range: Negative) pH: 6.5   (Normal Range: 5.0-8.0) Protein: negative   (Normal Range: Negative) Urobilinogen: 0.2   (Normal Range: 0-1) Nitrite: negative   (Normal Range: Negative) Leukocyte Esterace: negative   (Normal Range: Negative)    Comments: Army Fossa CMA  Jul 14, 2009 4:19 PM

## 2010-04-14 NOTE — Consult Note (Signed)
Summary: Gae Dry, MD Dermatology  Gae Dry, MD Dermatology   Imported By: Lanelle Bal 03/20/2009 09:06:59  _____________________________________________________________________  External Attachment:    Type:   Image     Comment:   External Document

## 2010-04-14 NOTE — Progress Notes (Signed)
Summary: symptoms return  Phone Note Call from Patient Call back at [phone]   Caller: Patient Summary of Call: PT SEEN ON 07-21-09 AND WAS DX WITH SINUSITIS. PT FINISHED ALL THE AUGMENTIN 875-125 MG TABS BUT NOW SYMPTOMS HAVE RETURN. PT C/O SORE THROAT. PLS Advise...............Marland KitchenFelecia Deloach CMA  Aug 05, 2009 9:41 AM    Follow-up for Phone Call        avelox 400 mg 1 by mouth once daily for 10 days ---ov if no better after that Follow-up by: Loreen Freud DO,  Aug 05, 2009 9:54 AM  Additional Follow-up for Phone Call Additional follow up Details #1::        left message to call office to verify pharmacy.Marland KitchenMarland KitchenMarland KitchenFelecia Deloach CMA  Aug 05, 2009 12:41 PM   pt called back confirming [facility]  [address], left pt detail message rx sent to pharmacy and OVINB...........Marland KitchenFelecia Deloach CMA  Aug 05, 2009 4:07 PM     New/Updated Medications: AVELOX 400 MG TABS (MOXIFLOXACIN HCL) Take 1 by mouth once daily for 10 days Prescriptions: AVELOX 400 MG TABS (MOXIFLOXACIN HCL) Take 1 by mouth once daily for 10 days  #10 x 0   Entered by:   Jeremy Johann CMA   Authorized by:   Loreen Freud DO   Signed by:   Jeremy Johann CMA on 08/05/2009   Method used:   Faxed to ...       [facility]  [address] # [phone]* (retail)       [phone]       [place], [place]  [address]       Ph: [phone]       Fax: [phone]   RxID:   [id number]

## 2010-04-14 NOTE — Letter (Signed)
Summary: Out of Work  [facility]  [address] [place], [place] [address]   Phone: [phone]  Fax: [phone]    Jul 24, 2009   Employee:  Angela Thornton    To Whom It May Concern:   For Medical reasons, please excuse the above named employee from work for the following dates:  Start:   07/21/09     End:   07/25/09  If you need additional information, please feel free to contact our office.         Sincerely,    Loreen Freud, DO

## 2010-04-14 NOTE — Assessment & Plan Note (Signed)
Summary: temp,chills,body ache/cbs   Vital Signs:  Patient profile:   55 year old female Weight:      138 pounds Temp:     97.6 degrees F oral Pulse rate:   82 / minute Pulse rhythm:   regular BP sitting:   122 / 76  (left arm) Cuff size:   regular  Vitals Entered By: Army Fossa CMA (Jul 21, 2009 1:27 PM) CC: Pt here for chills, sore throat, ear pain x 1 day., URI symptoms   History of Present Illness:       This is a 55 year old woman who presents with URI symptoms.  The symptoms began 2 days ago.  + tylenol or ibuprofen--- with relief of fever---no other otc meds.  The patient complains of nasal congestion, purulent nasal discharge, and sore throat, but denies clear nasal discharge, dry cough, productive cough, earache, and sick contacts.  Associated symptoms include fever of 100.5-103 degrees.  The patient denies fever, low-grade fever (<100.5 degrees), fever of 103.1-104 degrees, fever to >104 degrees, stiff neck, dyspnea, wheezing, rash, vomiting, diarrhea, use of an antipyretic, and response to antipyretic.  The patient also reports headache.  The patient denies itchy watery eyes, itchy throat, sneezing, seasonal symptoms, response to antihistamine, muscle aches, and severe fatigue.  The patient denies the following risk factors for Strep sinusitis: unilateral facial pain, unilateral nasal discharge, poor response to decongestant, double sickening, tooth pain, Strep exposure, tender adenopathy, and absence of cough.    Current Medications (verified): 1)  Orthotics .... Re Plantar Fascitis===  Pt Will Benefit From Orthotics For This Problem 2)  Lisinopril-Hydrochlorothiazide 20-25 Mg Tabs (Lisinopril-Hydrochlorothiazide) .Marland Kitchen.. 1 By Mouth Once Daily 3)  Nexium 40 Mg Cpdr (Esomeprazole Magnesium) .Marland Kitchen.. 1 By Mouth Daily 4)  Ferrous Sulfate 325 (65 Fe) Mg Tabs (Ferrous Sulfate) .... One Tablet By Mouth Once Daily 5)  Mobic 15 Mg Tabs (Meloxicam) .... 1/2 -1 By Mouth Once Daily 6)   Flexeril 10 Mg Tabs (Cyclobenzaprine Hcl) .... 1/2 -1 By Mouth Three Times A Day As Needed 7)  Cipro 500 Mg Tabs (Ciprofloxacin Hcl) .Marland Kitchen.. 1 By Mouth Two Times A Day X 5 Days. 8)  Macrobid 100 Mg Caps (Nitrofurantoin Monohyd Macro) .... Take 1 By Mouth Qid. 9)  Pyridium 200 Mg Tabs (Phenazopyridine Hcl) .... Take 1 Tab By Mouth Three Times A Day After Meals. 10)  Augmentin 875-125 Mg Tabs (Amoxicillin-Pot Clavulanate) .Marland Kitchen.. 1 By Mouth Two Times A Day 11)  Nasonex 50 Mcg/act Susp (Mometasone Furoate) .... 2 Sprays Each Nostril Once Daily 12)  Claritin 10 Mg Tabs (Loratadine) .Marland Kitchen.. 1 By Mouth Once Daily  Allergies: 1)  ! Cipro  Past History:  Past medical, surgical, family and social histories (including risk factors) reviewed for relevance to current acute and chronic problems.  Past Medical History: Reviewed history from 08/28/2007 and no changes required. Hypertension Anemia-NOS GERD  Past Surgical History: Reviewed history from 07/21/2006 and no changes required. Cervical fusion (12/20/2005) Inguinal herniorrhaphy, 1992 mole removal FAce/ neck 2007 L breast cyst 1996  Family History: Reviewed history from 03/10/2009 and no changes required. Family History of CAD Female 1st degree relative <50 Family History Depression Family History Hypertension F- alz dem No FH of Colon Cancer:  Social History: Reviewed history from 11/21/2007 and no changes required. 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 Smoked Drug use-no Regular exercise-yes  Review of Systems      See HPI  Physical Exam  General:  Well-developed,well-nourished,in no acute  distress; alert,appropriate and cooperative throughout examination Ears:  External ear exam shows no significant lesions or deformities.  Otoscopic examination reveals clear canals, tympanic membranes are intact bilaterally without bulging, retraction, inflammation or discharge. Hearing is grossly normal bilaterally. Nose:  L  frontal sinus tenderness, L maxillary sinus tenderness, R frontal sinus tenderness, and R maxillary sinus tenderness.   Mouth:  no exudates and pharyngeal erythema.   Neck:  cervical lymphadenopathy.   Lungs:  Normal respiratory effort, chest expands symmetrically. Lungs are clear to auscultation, no crackles or wheezes. Heart:  normal rate and no murmur.   Skin:  Intact without suspicious lesions or rashes Psych:  Oriented X3 and normally interactive.     Impression & Recommendations:  Problem # 1:  SINUSITIS - ACUTE-NOS (ICD-461.9)  The following medications were removed from the medication list:    Cipro 500 Mg Tabs (Ciprofloxacin hcl) .Marland Kitchen... 1 by mouth two times a day x 5 days. Her updated medication list for this problem includes:    Augmentin 875-125 Mg Tabs (Amoxicillin-pot clavulanate) .Marland Kitchen... 1 by mouth two times a day    Nasonex 50 Mcg/act Susp (Mometasone furoate) .Marland Kitchen... 2 sprays each nostril once daily  Instructed on treatment. Call if symptoms persist or worsen.   Orders: Rapid Strep (03474)  Complete Medication List: 1)  Orthotics  .... Re plantar fascitis===  pt will benefit from orthotics for this problem 2)  Lisinopril-hydrochlorothiazide 20-25 Mg Tabs (Lisinopril-hydrochlorothiazide) .Marland Kitchen.. 1 by mouth once daily 3)  Nexium 40 Mg Cpdr (Esomeprazole magnesium) .Marland Kitchen.. 1 by mouth daily 4)  Ferrous Sulfate 325 (65 Fe) Mg Tabs (Ferrous sulfate) .... One tablet by mouth once daily 5)  Mobic 15 Mg Tabs (Meloxicam) .... 1/2 -1 by mouth once daily 6)  Flexeril 10 Mg Tabs (Cyclobenzaprine hcl) .... 1/2 -1 by mouth three times a day as needed 7)  Macrobid 100 Mg Caps (Nitrofurantoin monohyd macro) .... Take 1 by mouth qid. 8)  Pyridium 200 Mg Tabs (Phenazopyridine hcl) .... Take 1 tab by mouth three times a day after meals. 9)  Augmentin 875-125 Mg Tabs (Amoxicillin-pot clavulanate) .Marland Kitchen.. 1 by mouth two times a day 10)  Nasonex 50 Mcg/act Susp (Mometasone furoate) .... 2 sprays each  nostril once daily 11)  Claritin 10 Mg Tabs (Loratadine) .Marland Kitchen.. 1 by mouth once daily  Patient Instructions: 1)  Acute sinusitis symptoms for less than 10 days are not helped by antibiotics. Use warm moist compresses, and over the counter decongestants( only as directed). Call if no improvement in 5-7 days, sooner if increasing pain, fever, or new symptoms. ---- antibiotic given secondary to fever and adenopathy Prescriptions: AUGMENTIN 875-125 MG TABS (AMOXICILLIN-POT CLAVULANATE) 1 by mouth two times a day  #20 x 0   Entered and Authorized by:   Loreen Freud DO   Signed by:   Loreen Freud DO on 07/21/2009   Method used:   Electronically to        [facility] # [phone]* (retail)       [address]       [place], [place]  [address]       Ph: [phone]       Fax: [phone]   RxID:   [id number]   Laboratory Results    Other Tests  Rapid Strep: negative Comments: Army Fossa CMA  Jul 21, 2009 1:36 PM

## 2010-04-14 NOTE — Progress Notes (Signed)
Summary: Prior Auth. for Nexium  Phone Note Call from Patient Call back at [phone] Cell   Caller: Patient Call For: Dr. Arlyce Dice Reason for Call: Talk to Nurse Summary of Call: Ins. is needing prior approval for her Nexium Initial call taken by: Karna Christmas,  Aug 06, 2009 8:17 AM  Follow-up for Phone Call        completed. See note below. Follow-up by: Lamona Curl CMA Duncan Dull),  Aug 07, 2009 12:07 PM   Phone Note Call from Patient Call back at [phone] Cell   Caller: Patient Call For: Dr. Arlyce Dice Reason for Call: Talk to Nurse Summary of Call: Ins. is needing prior approval for her Nexium Initial call taken by: Karna Christmas,  Aug 06, 2009 8:17 AM  Follow-up for Phone Call        completed. See note below. Follow-up by: Lamona Curl CMA Duncan Dull),  Aug 07, 2009 12:07 PM    Case ID: [id number] Member Number: [id number] Case Type: Initial Review Case Start Date: 08/07/2009 Case Status: Coverage has been APPROVED. You will receive a confirmation letter confirming approval of this medication. The patient will also be notified of this approval via an automated outbound phone call or a letter. Please allow approximately 2 hours to update our system with the approval. Once updated, the prescription can be re-submitted.   Coverage Start Date: 07/17/2009 Coverage End Date: 08/07/2010  Patient First Name: Angela Patient Last Name: Thornton DOB: December 27, 1955 Patient Street Address: [address]   Patient City: [place] Patient State:  Patient Zip: [phone]  Drug Name & Strength: Nexium 40 Mg

## 2010-04-14 NOTE — Letter (Signed)
Summary: Results Letter  [facility]  [address] [place], [place] [address]   Phone: [phone]  Fax: [phone]        June 06, 2009 MRN: [id number]    Angela Thornton [address] [place], [place]  [address]    Dear [name],  Your hemeoccult cards testing for microscopic bleeding were negative.  No further GI workup is required at this time.   Please continue with the recommendations that we previously discussed. Should you have any further questions or concerns, feel free to contact me.    Sincerely,  Barbette Hair. Arlyce Dice, M.D., [facility]          Sincerely,  Louis Meckel MD  This letter has been electronically signed by your physician.  Appended Document: Results Letter letter mailed

## 2010-04-14 NOTE — Progress Notes (Signed)
Summary: out of work note  Phone Note Call from Patient Call back at [phone]   Caller: Patient Summary of Call: PT LEFT VM that she has been out of work since appt on 07-21-09. pt would like to get a note for 07-21-09 until 07-25-09 and return to work on monday. pls advise.................................Marland KitchenFelecia Deloach CMA  Jul 24, 2009 9:00 AM   Follow-up for Phone Call        ok to give note Follow-up by: Loreen Freud DO,  Jul 24, 2009 11:34 AM  Additional Follow-up for Phone Call Additional follow up Details #1::        done. Army Fossa CMA  Jul 24, 2009 11:36 AM

## 2010-04-14 NOTE — Consult Note (Signed)
Summary: [facility]  [facility]   Imported By: Lanelle Bal 08/06/2009 08:32:07  _____________________________________________________________________  External Attachment:    Type:   Image     Comment:   External Document

## 2010-04-14 NOTE — Progress Notes (Signed)
Summary: Lab Results  Phone Note Outgoing Call   Call placed by: Army Fossa CMA,  May 19, 2009 2:46 PM Reason for Call: Discuss lab or test results Summary of Call: Regarding lab results, left message with a female at the house number  anemic---- take slow fe 1 by mouth once daily ---recheck 1 month  285.9  cbcd, ferritin, ibc  Signed by Loreen Freud DO on 05/18/2009 at 9:27 AM  Follow-up for Phone Call        LMTCB.Army Fossa CMA  May 21, 2009 2:09 PM   Additional Follow-up for Phone Call Additional follow up Details #1::        LMTCB.Army Fossa CMA  May 22, 2009 9:00 AM     Additional Follow-up for Phone Call Additional follow up Details #2::    Left message on VM (number listed as work number) informing patient we have tried to reach her several times and we will mail labs and if she has questions or concerns she needs to call the office [phone] (Danielle/Dr.Lowne's assistant)  Labs were mailed!! Follow-up by: Shonna Chock,  May 27, 2009 9:12 AM

## 2010-04-14 NOTE — Letter (Signed)
Summary: [facility]: Immunoassay Fecal Occult Blood (iFOB) Order Form  [facility]  [address] [place], [place] [address]   Phone: [phone]  Fax: [phone]      [facility]: Immunoassay Fecal Occult Blood (iFOB) Order Form   May 27, 2009 MRN: [id number]   Angela Thornton [birth year]   Physicican Name:_ROBERT Kemper Hochman,MD  Diagnosis Code:569.3  RECTAL BLEEDING      Angela Fini,MD   Merri Ray CMA (AAMA)

## 2010-04-16 NOTE — Progress Notes (Signed)
Summary: Results   Phone Note Outgoing Call Call back at [phone] cell   Call placed by: Almeta Monas CMA Duncan Dull),  March 31, 2010 11:39 AM Call placed to: Patient Details for Reason: WBC slightly low and pt is still anemic----take slow FE with multivitamin-----recheck 1 month  285.9 ,  288.8  cbcd Summary of Call: Left message to call back on cell...Marland KitchenMarland KitchenMarland Kitchen Almeta Monas CMA Duncan Dull)  March 31, 2010 11:39 AM   Discuss with patient .............Marland KitchenFelecia Deloach CMA  March 31, 2010 11:42 AM

## 2010-04-16 NOTE — Assessment & Plan Note (Signed)
Summary: cpx/cbs   Vital Signs:  Patient profile:   55 year old female Height:      61 inches Weight:      142 pounds BMI:     26.93 Temp:     98.1 degrees F oral Pulse rate:   72 / minute Resp:     18 per minute BP sitting:   118 / 70  (left arm)  Vitals Entered By: Jeremy Johann CMA (March 24, 2010 1:01 PM) CC: CPX, not fasting, no pap, irregular periods, increase sweating   History of Present Illness: Pt here for cpe ---pt is not fasting.   Pt sees Gyn.  Pt having some issues with irregular periods and hot flashes.    Preventive Screening-Counseling & Management  Alcohol-Tobacco     Alcohol drinks/day: <1     Alcohol type: beer     Smoking Status: never     Passive Smoke Exposure: no  Caffeine-Diet-Exercise     Caffeine use/day: 0     Does Patient Exercise: yes     Type of exercise: walking and steps     Exercise (avg: min/session): <30     Times/week: 3  Hep-HIV-STD-Contraception     HIV Risk: no     Dental Visit-last 6 months yes     Dental Care Counseling: not indicated; dental care within six months     SBE monthly: yes     SBE Education/Counseling: not indicated; SBE done regularly  Safety-Violence-Falls     Seat Belt Use: 100      Sexual History:  currently monogamous and dating.        Drug Use:  never.    Current Medications (verified): 1)  Orthotics .... Re Plantar Fascitis===  Pt Will Benefit From Orthotics For This Problem 2)  Lisinopril-Hydrochlorothiazide 20-25 Mg Tabs (Lisinopril-Hydrochlorothiazide) .Marland Kitchen.. 1 By Mouth Once Daily 3)  Nexium 40 Mg Cpdr (Esomeprazole Magnesium) .Marland Kitchen.. 1 By Mouth Daily 4)  Ferrous Sulfate 325 (65 Fe) Mg Tabs (Ferrous Sulfate) .... One Tablet By Mouth Once Daily  Allergies (verified): 1)  ! Cipro  Past History:  Past Medical History: Last updated: 08/28/2007 Hypertension Anemia-NOS GERD  Past Surgical History: Last updated: 07/21/2006 Cervical fusion (12/20/2005) Inguinal herniorrhaphy, 1992 mole  removal FAce/ neck 2007 L breast cyst 1996  Family History: Last updated: 03/10/2009 Family History of CAD Female 1st degree relative <50 Family History Depression Family History Hypertension F- alz dem No FH of Colon Cancer:  Social History: Last updated: 11/21/2007 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 Smoked Drug use-no Regular exercise-yes  Risk Factors: Alcohol Use: <1 (03/24/2010) Caffeine Use: 0 (03/24/2010) Exercise: yes (03/24/2010)  Risk Factors: Smoking Status: never (03/24/2010) Passive Smoke Exposure: no (03/24/2010)  Family History: Reviewed history from 03/10/2009 and no changes required. Family History of CAD Female 1st degree relative <50 Family History Depression Family History Hypertension F- alz dem No FH of Colon Cancer:  Social History: Reviewed history from 11/21/2007 and no changes required. 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 Eyes:  Denies blurring, discharge, double vision, eye irritation, eye pain, halos, itching, light sensitivity, red eye, vision loss-1 eye, and vision loss-both eyes; optho--q1y. ENT:  Denies decreased hearing, difficulty swallowing, ear discharge, earache, hoarseness, nasal congestion, nosebleeds, postnasal drainage, ringing in ears, sinus pressure, and sore throat. CV:  Denies bluish discoloration of lips or nails, chest pain or discomfort, difficulty breathing at night, difficulty breathing while lying down, fainting, fatigue, leg cramps with exertion, lightheadness, near fainting, palpitations, shortness of breath with exertion, swelling of feet, swelling of hands, and weight gain. Resp:  Denies chest discomfort, chest pain with inspiration, cough, coughing up blood, excessive snoring, hypersomnolence,  morning headaches, pleuritic, shortness of breath, sputum productive, and wheezing. GI:  Denies abdominal pain, bloody stools, change in bowel habits, constipation, dark tarry stools, diarrhea, excessive appetite, gas, hemorrhoids, indigestion, loss of appetite, nausea, vomiting, vomiting blood, and yellowish skin color. GU:  Complains of abnormal vaginal bleeding; denies decreased libido, discharge, dysuria, genital sores, hematuria, incontinence, nocturia, urinary frequency, and urinary hesitancy. MS:  Denies joint pain, joint redness, joint swelling, loss of strength, low back pain, mid back pain, muscle aches, muscle , cramps, muscle weakness, stiffness, and thoracic pain. Derm:  Denies changes in color of skin, changes in nail beds, dryness, excessive perspiration, flushing, hair loss, insect bite(s), itching, lesion(s), poor wound healing, and rash. Neuro:  Denies brief paralysis, difficulty with concentration, disturbances in coordination, falling down, headaches, inability to speak, memory loss, numbness, poor balance, seizures, sensation of room spinning, tingling, tremors, visual disturbances, and weakness. Psych:  Denies alternate hallucination ( auditory/visual), anxiety, depression, easily angered, easily tearful, irritability, mental problems, panic attacks, sense of great danger, suicidal thoughts/plans, thoughts of violence, unusual visions or sounds, and thoughts /plans of harming others. Endo:  Denies cold intolerance, excessive hunger, excessive thirst, excessive urination, heat intolerance, polyuria, and weight change. Heme:  Denies abnormal bruising, bleeding, enlarge lymph nodes, fevers, pallor, and skin discoloration. Allergy:  Denies hives or rash, itching eyes, persistent infections, seasonal allergies, and sneezing.  Physical Exam  General:  Well-developed,well-nourished,in no acute distress; alert,appropriate and cooperative throughout examination Head:  Normocephalic and  atraumatic without obvious abnormalities. No apparent alopecia or balding. Eyes:  vision grossly intact, pupils equal, pupils round, pupils reactive to light, and no injection.   Ears:  External ear exam shows no significant lesions or deformities.  Otoscopic examination reveals clear canals, tympanic membranes are intact bilaterally without bulging, retraction, inflammation or discharge. Hearing is grossly normal bilaterally. Nose:  External nasal examination shows no deformity or inflammation. Nasal mucosa are pink and moist without lesions or exudates. Mouth:  Oral mucosa and oropharynx without lesions or exudates.  Teeth in good repair. Neck:  No deformities, masses, or tenderness noted.no carotid bruits.   Chest Wall:  No deformities, masses, or tenderness noted. Breasts:  gyn Lungs:  Normal respiratory effort, chest expands symmetrically. Lungs are clear to auscultation, no crackles or wheezes. Heart:  normal rate and no murmur.   Abdomen:  Bowel sounds positive,abdomen soft and non-tender without masses, organomegaly or hernias noted. Rectal:  gyn Genitalia:  gyn Msk:  normal ROM, no joint tenderness, no joint swelling, no joint warmth, no redness over joints, no joint deformities, no joint instability, and no crepitation.   Pulses:  R and L carotid,radial,femoral,dorsalis pedis and posterior tibial pulses are full and equal bilaterally Extremities:  No clubbing, cyanosis, edema, or deformity noted with normal full range of motion of all joints.   Neurologic:  No cranial nerve deficits noted. Station and gait are normal. Plantar reflexes are down-going bilaterally. DTRs are symmetrical throughout. Sensory, motor and coordinative functions appear intact. Skin:  Intact without suspicious lesions or rashes Cervical Nodes:  No lymphadenopathy noted Axillary Nodes:  No palpable lymphadenopathy Psych:  Cognition and judgment appear intact.  Alert and cooperative with normal attention span and  concentration. No apparent delusions, illusions, hallucinations   Impression & Recommendations:  Problem # 1:  PREVENTIVE HEALTH CARE (ICD-V70.0)  check fasting labs ghm utd f/u GI for colon and GERD  Orders: EKG w/ Interpretation (93000)  Problem # 2:  GERD (ICD-530.81)  Her updated medication list for this problem includes:    Nexium 40 Mg Cpdr (Esomeprazole magnesium) .Marland Kitchen... 1 by mouth daily  Diagnostics Reviewed:  EGD: Location: [facility]   (10/13/2007) Discussed lifestyle modifications, diet, antacids/medications, and preventive measures. Handout provided.   Problem # 3:  HYPERTENSION (ICD-401.9)  Her updated medication list for this problem includes:    Lisinopril-hydrochlorothiazide 20-25 Mg Tabs (Lisinopril-hydrochlorothiazide) .Marland Kitchen... 1 by mouth once daily  BP today: 118/70 Prior BP: 122/76 (07/21/2009)  Prior 10 Yr Risk Heart Disease: Not enough information (12/13/2006)  Labs Reviewed: K+: 3.6 (02/12/2009) Creat: : 0.8 (02/12/2009)   Chol: 154 (02/12/2009)   HDL: 60.30 (02/12/2009)   LDL: 86 (02/12/2009)   TG: 37.0 (02/12/2009)  Complete Medication List: 1)  Orthotics  .... Re plantar fascitis===  pt will benefit from orthotics for this problem 2)  Lisinopril-hydrochlorothiazide 20-25 Mg Tabs (Lisinopril-hydrochlorothiazide) .Marland Kitchen.. 1 by mouth once daily 3)  Nexium 40 Mg Cpdr (Esomeprazole magnesium) .Marland Kitchen.. 1 by mouth daily 4)  Ferrous Sulfate 325 (65 Fe) Mg Tabs (Ferrous sulfate) .... One tablet by mouth once daily  Patient Instructions: 1)  V70.0  ,   401.9  cbcd,  bmp, hep, lipid, TSH, UA ----fasting labs Prescriptions: LISINOPRIL-HYDROCHLOROTHIAZIDE 20-25 MG TABS (LISINOPRIL-HYDROCHLOROTHIAZIDE) 1 by mouth once daily  #90 Each x 3   Entered and Authorized by:   Loreen Freud DO   Signed by:   Loreen Freud DO on 03/24/2010   Method used:   Electronically to        [facility] # [phone]* (retail)       [address]       [place], [place]  [address]       Ph: [phone]       Fax: [phone]   RxID:   [id number]    Orders Added: 1)  Est. Patient 40-64 years [99396] 2)  EKG w/ Interpretation [93000]    Last Flu Vaccine:  Fluvax 3+ (02/12/2009 8:58:57 AM) Flu Vaccine Result Date:  12/24/2009 Flu Vaccine Result:  given Flu Vaccine Next Due:  1 yr Last PAP:  normal (04/24/2008 9:51:18 AM) PAP Result Date:  04/23/2009 PAP Result:  normal PAP Next Due:  1 yr Last Mammogram:  normal (08/28/2008 9:51:18 AM) Mammogram Result Date:  10/29/2009 Mammogram Result:  abnormal Mammogram Next Due:  6 mo

## 2010-04-16 NOTE — Assessment & Plan Note (Addendum)
Summary: headache,bodyache,sorethroat,congested/kn   Vital Signs:  Patient profile:   55 year old female Weight:      143.0 pounds Temp:     97.8 degrees F oral BP sitting:   118 / 76  (left arm) Cuff size:   regular  Vitals Entered By: Almeta Monas CMA Duncan Dull) (April 09, 2010 1:56 PM) CC: X2days c/o URI, URI symptoms   CC:  X2days c/o URI and URI symptoms.  History of Present Illness:  URI Symptoms      This is a 55 year old woman who presents with URI symptoms.  The symptoms began duration > 3 days ago.  Pt taking otc sinus congestion and vitamins with no relief.  The patient reports nasal congestion, clear nasal discharge, sore throat, dry cough, earache, and sick contacts.  The patient denies fever, low-grade fever (<100.5 degrees), fever of 100.5-103 degrees, fever of 103.1-104 degrees, fever to >104 degrees, stiff neck, dyspnea, wheezing, rash, vomiting, diarrhea, use of an antipyretic, and response to antipyretic.  The patient also reports headache and muscle aches.  The patient denies itchy watery eyes, itchy throat, sneezing, seasonal symptoms, response to antihistamine, and severe fatigue.  Risk factors for Strep sinusitis include Strep exposure.  The patient denies the following risk factors for Strep sinusitis: unilateral facial pain, unilateral nasal discharge, poor response to decongestant, double sickening, tooth pain, tender adenopathy, and absence of cough.    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) 7)  Personal History of Colonic Polyps  (ICD-V12.72) 8)  Vitamin B12 Deficiency  (ICD-266.2) 9)  Guaiac Positive Stool  (ICD-578.1) 10)  Other Anxiety States  (ICD-300.09) 11)  Preventive Health Care  (ICD-V70.0) 12)  Nevi, Multiple   (ICD-216.9) 13)  Seborrheic Keratosis  (ICD-702.19) 14)  Vaginitis  (ICD-616.10) 15)  Uti  (ICD-599.0) 16)  Mole  (ICD-216.9) 17)  Special Screening For Malignant Neoplasms Colon  (ICD-V76.51) 18)  Chest Pain Unspecified  (ICD-786.50) 19)  Gerd  (ICD-530.81) 20)  Plantar Fasciitis, Right  (ICD-728.71) 21)  Preventive Health Care  (ICD-V70.0) 22)  Dizziness  (ICD-780.4) 23)  Upper Respiratory Infection  (ICD-465.9) 24)  Arm Pain, Right  (ICD-729.5) 25)  Family History Depression  (ICD-V17.0) 26)  Family History of Cad Female 1st Degree Relative <50  (ICD-V17.3) 27)  Anemia-nos  (ICD-285.9) 28)  Hypertension  (ICD-401.9)  Medications Prior to Update: 1)  Orthotics .... Re Plantar Fascitis===  Pt Will Benefit From Orthotics For This Problem 2)  Lisinopril-Hydrochlorothiazide 20-25 Mg Tabs (Lisinopril-Hydrochlorothiazide) .Marland Kitchen.. 1 By Mouth Once Daily 3)  Nexium 40 Mg Cpdr (Esomeprazole Magnesium) .Marland Kitchen.. 1 By Mouth Daily 4)  Ferrous Sulfate 325 (65 Fe) Mg Tabs (Ferrous Sulfate) .... One Tablet By Mouth Once Daily  Current Medications (verified): 1)  Orthotics .... Re Plantar Fascitis===  Pt Will Benefit From Orthotics For This Problem 2)  Lisinopril-Hydrochlorothiazide 20-25 Mg Tabs (Lisinopril-Hydrochlorothiazide) .Marland Kitchen.. 1 By Mouth Once Daily 3)  Nexium 40 Mg Cpdr (Esomeprazole Magnesium) .Marland Kitchen.. 1 By Mouth Daily 4)  Ferrous Sulfate 325 (65 Fe) Mg Tabs (Ferrous Sulfate) .... One Tablet By Mouth Once Daily 5)  Ceftin 500 Mg Tabs (Cefuroxime Axetil) .Marland Kitchen.. 1 By Mouth Two Times A Day 6)  Flonase 50 Mcg/act Susp (Fluticasone Propionate) .... 2 Sprays Each  Nostril Once Daily  Allergies (verified): 1)  ! Cipro  Past History:  Family History: Last updated: 53/27/2010 Family History of CAD Female 1st degree relative <50 Family History Depression Family History Hypertension F- alz dem No FH of Colon Cancer:  Social History: Last updated: 11/21/2007 Occupation: Higher education careers adviser. Divorced Never Smoked Drug use-no Regular exercise-yes  Risk Factors: Alcohol Use: <1 (03/24/2010) Caffeine Use: 0 (03/24/2010) Exercise: yes (03/24/2010)  Risk Factors: Smoking Status: never (03/24/2010) Passive Smoke Exposure: no (03/24/2010)  Past medical, surgical, family and social histories (including risk factors) reviewed for relevance to current acute and chronic problems.  Past Medical History: Reviewed history from 08/28/2007 and no changes required. Hypertension Anemia-NOS GERD  Past Surgical History: Reviewed history from 07/21/2006 and no changes required. Cervical fusion (12/20/2005) Inguinal herniorrhaphy, 1992 mole removal FAce/ neck 2007 L breast cyst 1996  Family History: Reviewed history from 03/10/2009 and no changes required. Family History of CAD Female 1st degree relative <50 Family History Depression Family History Hypertension F- alz dem No FH of Colon Cancer:  Social History: Reviewed history from 11/21/2007 and no changes required. 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  Otoscopic examination reveals clear canals, tympanic membranes are intact bilaterally without bulging, retraction, inflammation or discharge. Hearing is grossly normal bilaterally. Nose:  no external deformity, no external erythema, mucosal erythema, and mucosal edema.   Mouth:  Oral mucosa and oropharynx without lesions or exudates.  Teeth in good repair. Neck:  No deformities, masses, or tenderness noted. Lungs:  Normal respiratory effort, chest expands symmetrically. Lungs are clear to auscultation, no crackles or wheezes. Psych:  Oriented X3 and normally interactive.      Impression & Recommendations:  Problem # 1:  URI (ICD-465.9)  Instructed on symptomatic treatment. Call if symptoms persist or worsen.   Complete Medication List: 1)  Orthotics  .... Re plantar fascitis===  pt will benefit from orthotics for this problem 2)  Lisinopril-hydrochlorothiazide 20-25 Mg Tabs (Lisinopril-hydrochlorothiazide) .Marland Kitchen.. 1 by mouth once daily 3)  Nexium 40 Mg Cpdr (Esomeprazole magnesium) .Marland Kitchen.. 1 by mouth daily 4)  Ferrous Sulfate 325 (65 Fe) Mg Tabs (Ferrous sulfate) .... One tablet by mouth once daily 5)  Ceftin 500 Mg Tabs (Cefuroxime axetil) .Marland Kitchen.. 1 by mouth two times a day 6)  Flonase 50 Mcg/act Susp (Fluticasone propionate) .... 2 sprays each nostril once daily Prescriptions: FLONASE 50 MCG/ACT SUSP (FLUTICASONE PROPIONATE) 2 sprays each nostril once daily  #1 x 2   Entered and Authorized by:   Loreen Freud DO   Signed by:   Loreen Freud DO on 04/09/2010   Method used:   Electronically to        [facility] # [phone]* (retail)       [address]       [place], [place]  [address]       Ph: [phone]       Fax: [phone]   RxID:   [id number] CEFTIN 500 MG TABS (CEFUROXIME AXETIL) 1 by mouth two times a day  #20 x 0   Entered and Authorized by:   Loreen Freud DO   Signed by:   Loreen Freud DO on 04/09/2010  Method used:   Print then Give to Patient   RxID:   [id number]    Orders Added: 1)  Est. Patient Level III [99213]    Laboratory Results    Other Tests  Rapid Strep: negative   Appended Document: headache,bodyache,sorethroat,congested/kn Is pt still sick?----recheck 3-4 weeks  288.8  cbcd

## 2010-04-16 NOTE — Assessment & Plan Note (Signed)
Summary: YEARLY.Angela Thornton.   History of Present Illness Visit Type: Follow-up Visit Primary GI MD: Melvia Heaps MD [facility] Primary Provider: Loreen Freud DO Requesting Provider: Loreen Freud DO Chief Complaint: follow-up History of Present Illness:   Angela Thornton has returned for her annual visit for esophageal reflux.  This had been well controlled with nexium.  GI history is pertinent for an esophageal stricture which been dilated.  She is s/p hiatal hernia repair.  She has no other GI complaints.  In 2009 colonoscopy demonstrated a small transverse colon polyp that was not reidentified on withdrawal of the colonoscope.  Recommendations were for followup colonoscopy in 3 years.   GI Review of Systems      Denies abdominal pain, acid reflux, belching, bloating, chest pain, dysphagia with liquids, dysphagia with solids, heartburn, loss of appetite, nausea, vomiting, vomiting blood, weight loss, and  weight gain.        Denies anal fissure, black tarry stools, change in bowel habit, constipation, diarrhea, diverticulosis, fecal incontinence, heme positive stool, hemorrhoids, irritable bowel syndrome, jaundice, light color stool, liver problems, rectal bleeding, and  rectal pain.    Current Medications (verified): 1)  Orthotics .... Re Plantar Fascitis===  Pt Will Benefit From Orthotics For This Problem 2)  Lisinopril-Hydrochlorothiazide 20-25 Mg Tabs (Lisinopril-Hydrochlorothiazide) .Marland Kitchen.. 1 By Mouth Once Daily 3)  Nexium 40 Mg Cpdr (Esomeprazole Magnesium) .Marland Kitchen.. 1 By Mouth Daily 4)  Ferrous Sulfate 325 (65 Fe) Mg Tabs (Ferrous Sulfate) .... One Tablet By Mouth Once Daily  Allergies (verified): 1)  ! Cipro  Past History:  Past Medical History: Reviewed history from 08/28/2007 and no changes required. Hypertension Anemia-NOS GERD  Past Surgical History: Reviewed history from 07/21/2006 and no changes required. Cervical fusion (12/20/2005) Inguinal herniorrhaphy, 1992 mole removal FAce/  neck 2007 L breast cyst 1996  Family History: Reviewed history from 03/10/2009 and no changes required. Family History of CAD Female 1st degree relative <50 Family History Depression Family History Hypertension F- alz dem No FH of Colon Cancer:  Social History: Reviewed history from 11/21/2007 and no changes required. 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 Smoked Drug use-no Regular exercise-yes  Review of Systems  The patient denies allergy/sinus, anemia, anxiety-new, arthritis/joint pain, back pain, blood in urine, breast changes/lumps, change in vision, confusion, cough, coughing up blood, depression-new, fainting, fatigue, fever, headaches-new, hearing problems, heart murmur, heart rhythm changes, itching, menstrual pain, muscle pains/cramps, night sweats, nosebleeds, pregnancy symptoms, shortness of breath, skin rash, sleeping problems, sore throat, swelling of feet/legs, swollen lymph glands, thirst - excessive , urination - excessive , urination changes/pain, urine leakage, vision changes, and voice change.    Vital Signs:  Patient profile:   55 year old female Height:      61 inches Weight:      141 pounds BMI:     26.74 Pulse rate:   68 / minute Pulse rhythm:   regular BP sitting:   110 / 70  (left arm)  Vitals Entered By: Milford Cage NCMA (April 07, 2010 1:46 PM)  Physical Exam  Additional Exam:  On physical exam she is a well-developed well-nourished female  skin: anicteric HEENT: normocephalic; PEERLA; no nasal or pharyngeal abnormalities neck: supple nodes: no cervical lymphadenopathy chest: clear to ausculatation and percussion heart: no murmurs, gallops, or rubs abd: soft, nontender; BS normoactive; no abdominal masses, tenderness, organomegaly rectal: deferred ext: no cynanosis, clubbing, edema skeletal: no deformities neuro: oriented x 3; no focal abnormalities    Impression & Recommendations:  Problem #  1:  STRICTURE AND STENOSIS  OF ESOPHAGUS (ICD-530.3) Assessment Unchanged Patient remains asymptomatic.  Plan repeat dilatation p.r.n..  Problem # 2:  PERSONAL HISTORY OF COLONIC POLYPS (ICD-V12.72) Plan followup colonoscopy in September, 2012  Problem # 3:  GERD (ICD-530.81) Continue nexium  Patient Instructions: 1)  Copy sent to : Loreen Freud DO 2)  Follow up as needed  3)  The medication list was reviewed and reconciled.  All changed / newly prescribed medications were explained.  A complete medication list was provided to the patient / caregiver.

## 2010-04-17 ENCOUNTER — Encounter: Payer: Self-pay | Admitting: Family Medicine

## 2010-04-27 ENCOUNTER — Encounter (INDEPENDENT_AMBULATORY_CARE_PROVIDER_SITE_OTHER): Payer: Self-pay | Admitting: *Deleted

## 2010-04-27 ENCOUNTER — Other Ambulatory Visit (INDEPENDENT_AMBULATORY_CARE_PROVIDER_SITE_OTHER): Payer: BC Managed Care – PPO

## 2010-04-27 ENCOUNTER — Other Ambulatory Visit: Payer: Self-pay | Admitting: Family Medicine

## 2010-04-27 DIAGNOSIS — D7289 Other specified disorders of white blood cells: Secondary | ICD-10-CM

## 2010-04-27 DIAGNOSIS — D649 Anemia, unspecified: Secondary | ICD-10-CM

## 2010-04-28 LAB — CBC WITH DIFFERENTIAL/PLATELET
Eosinophils Relative: 2.8 % (ref 0.0–5.0)
HCT: 35.9 % — ABNORMAL LOW (ref 36.0–46.0)
Hemoglobin: 12.1 g/dL (ref 12.0–15.0)
Lymphs Abs: 1.7 10*3/uL (ref 0.7–4.0)
MCV: 90.8 fl (ref 78.0–100.0)
Monocytes Absolute: 0.7 10*3/uL (ref 0.1–1.0)
Monocytes Relative: 21.8 % — ABNORMAL HIGH (ref 3.0–12.0)
Neutro Abs: 0.7 10*3/uL — ABNORMAL LOW (ref 1.4–7.7)
Platelets: 149 10*3/uL — ABNORMAL LOW (ref 150.0–400.0)
WBC: 3.2 10*3/uL — ABNORMAL LOW (ref 4.5–10.5)

## 2010-04-29 ENCOUNTER — Encounter: Payer: Self-pay | Admitting: Family Medicine

## 2010-05-06 ENCOUNTER — Other Ambulatory Visit: Payer: Self-pay | Admitting: Obstetrics and Gynecology

## 2010-05-19 ENCOUNTER — Ambulatory Visit (INDEPENDENT_AMBULATORY_CARE_PROVIDER_SITE_OTHER): Payer: BC Managed Care – PPO | Admitting: Family Medicine

## 2010-05-19 ENCOUNTER — Encounter: Payer: Self-pay | Admitting: Family Medicine

## 2010-05-19 DIAGNOSIS — J019 Acute sinusitis, unspecified: Secondary | ICD-10-CM

## 2010-05-26 ENCOUNTER — Other Ambulatory Visit: Payer: BC Managed Care – PPO

## 2010-05-26 NOTE — Assessment & Plan Note (Signed)
Summary: sore throat, congested/cbs   Vital Signs:  Patient profile:   55 year old female Weight:      143.6 pounds Temp:     98.3 degrees F oral BP sitting:   124 / 80  (left arm) Cuff size:   regular  Vitals Entered By: Almeta Monas CMA Duncan Dull) (May 19, 2010 1:37 PM) CC: c/o URI x2days, URI symptoms   History of Present Illness:       This is a 55 year old woman who presents with URI symptoms.  The symptoms began 2 days ago.  Pt symptoms resolved after ceftin but then her daugher came home sick and her symptoms came back.  The patient complains of nasal congestion, purulent nasal discharge, and productive cough, but denies clear nasal discharge, sore throat, dry cough, earache, and sick contacts.  The patient denies fever, low-grade fever (<100.5 degrees), fever of 100.5-103 degrees, fever of 103.1-104 degrees, fever to >104 degrees, stiff neck, dyspnea, wheezing, rash, vomiting, diarrhea, use of an antipyretic, and response to antipyretic.  The patient also reports headache.  The patient denies itchy watery eyes, itchy throat, sneezing, seasonal symptoms, response to antihistamine, muscle aches, and severe fatigue.  The patient denies the following risk factors for Strep sinusitis: unilateral facial pain, unilateral nasal discharge, poor response to decongestant, double sickening, tooth pain, Strep exposure, tender adenopathy, and absence of cough.    Current Medications (verified): 1)  Orthotics .... Re Plantar Fascitis===  Pt Will Benefit From Orthotics For This Problem 2)  Lisinopril-Hydrochlorothiazide 20-25 Mg Tabs (Lisinopril-Hydrochlorothiazide) .Marland Kitchen.. 1 By Mouth Once Daily 3)  Nexium 40 Mg Cpdr (Esomeprazole Magnesium) .Marland Kitchen.. 1 By Mouth Daily 4)  Ferrous Sulfate 325 (65 Fe) Mg Tabs (Ferrous Sulfate) .... One Tablet By Mouth Once Daily 5)  Flonase 50 Mcg/act Susp (Fluticasone Propionate) .... 2 Sprays Each Nostril Once Daily 6)  Biaxin Xl 500 Mg Xr24h-Tab (Clarithromycin) .... 2 By  Mouth Once Daily  Allergies (verified): 1)  ! Cipro  Past History:  Past Medical History: Last updated: 08/28/2007 Hypertension Anemia-NOS GERD  Past Surgical History: Last updated: 07/21/2006 Cervical fusion (12/20/2005) Inguinal herniorrhaphy, 1992 mole removal FAce/ neck 2007 L breast cyst 1996  Family History: Last updated: 03/10/2009 Family History of CAD Female 1st degree relative <50 Family History Depression Family History Hypertension F- alz dem No FH of Colon Cancer:  Social History: Last updated: 11/21/2007 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 Smoked Drug use-no Regular exercise-yes  Risk Factors: Alcohol Use: <1 (03/24/2010) Caffeine Use: 0 (03/24/2010) Exercise: yes (03/24/2010)  Risk Factors: Smoking Status: never (03/24/2010) Passive Smoke Exposure: no (03/24/2010)  Family History: Reviewed history from 03/10/2009 and no changes required. Family History of CAD Female 1st degree relative <50 Family History Depression Family History Hypertension F- alz dem No FH of Colon Cancer:  Social History: Reviewed history from 11/21/2007 and no changes required. 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  Otoscopic examination reveals clear canals, tympanic membranes are intact bilaterally without bulging, retraction, inflammation or discharge. Hearing is grossly normal bilaterally. Nose:  no external deformity, mucosal erythema, L frontal sinus tenderness, L maxillary sinus tenderness, R frontal sinus tenderness, and R maxillary sinus tenderness.   Mouth:  Oral mucosa and oropharynx without lesions or exudates.  Teeth in good repair. Neck:  supple and  cervical lymphadenopathy.   Lungs:  Normal respiratory effort, chest expands symmetrically. Lungs are clear to auscultation, no crackles or wheezes. Heart:  Normal rate and regular rhythm. S1 and S2 normal without gallop, murmur, click, rub or other extra sounds. Psych:  Cognition and judgment appear intact. Alert and cooperative with normal attention span and concentration. No apparent delusions, illusions, hallucinations   Impression & Recommendations:  Problem # 1:  SINUSITIS - ACUTE-NOS (ICD-461.9)  Her updated medication list for this problem includes:    Flonase 50 Mcg/act Susp (Fluticasone propionate) .Marland Kitchen... 2 sprays each nostril once daily    Biaxin Xl 500 Mg Xr24h-tab (Clarithromycin) .Marland Kitchen... 2 by mouth once daily  Instructed on treatment. Call if symptoms persist or worsen.   Complete Medication List: 1)  Orthotics  .... Re plantar fascitis===  pt will benefit from orthotics for this problem 2)  Lisinopril-hydrochlorothiazide 20-25 Mg Tabs (Lisinopril-hydrochlorothiazide) .Marland Kitchen.. 1 by mouth once daily 3)  Nexium 40 Mg Cpdr (Esomeprazole magnesium) .Marland Kitchen.. 1 by mouth daily 4)  Ferrous Sulfate 325 (65 Fe) Mg Tabs (Ferrous sulfate) .... One tablet by mouth once daily 5)  Flonase 50 Mcg/act Susp (Fluticasone propionate) .... 2 sprays each nostril once daily 6)  Biaxin Xl 500 Mg Xr24h-tab (Clarithromycin) .... 2 by mouth once daily Prescriptions: BIAXIN XL 500 MG XR24H-TAB (CLARITHROMYCIN) 2 by mouth once daily  #28 x 0   Entered and Authorized by:   Loreen Freud DO   Signed by:   Loreen Freud DO on 05/19/2010   Method used:   Electronically to        [facility] # [phone]* (retail)       [address]       [place], [place]  [address]       Ph: [phone]       Fax: [phone]   RxID:   [id number]    Orders Added: 1)  Est. Patient Level III [27253]

## 2010-06-01 ENCOUNTER — Other Ambulatory Visit (INDEPENDENT_AMBULATORY_CARE_PROVIDER_SITE_OTHER): Payer: BC Managed Care – PPO

## 2010-06-01 ENCOUNTER — Other Ambulatory Visit (INDEPENDENT_AMBULATORY_CARE_PROVIDER_SITE_OTHER): Payer: BC Managed Care – PPO | Admitting: Family Medicine

## 2010-06-01 ENCOUNTER — Encounter (INDEPENDENT_AMBULATORY_CARE_PROVIDER_SITE_OTHER): Payer: Self-pay | Admitting: *Deleted

## 2010-06-01 DIAGNOSIS — D7289 Other specified disorders of white blood cells: Secondary | ICD-10-CM

## 2010-06-01 DIAGNOSIS — D649 Anemia, unspecified: Secondary | ICD-10-CM

## 2010-06-02 LAB — CBC WITH DIFFERENTIAL/PLATELET
Basophils Absolute: 0 10*3/uL (ref 0.0–0.1)
Eosinophils Absolute: 0.1 10*3/uL (ref 0.0–0.7)
HCT: 35.7 % — ABNORMAL LOW (ref 36.0–46.0)
Lymphs Abs: 1.6 10*3/uL (ref 0.7–4.0)
MCHC: 34.4 g/dL (ref 30.0–36.0)
MCV: 90.4 fl (ref 78.0–100.0)
Monocytes Absolute: 0.7 10*3/uL (ref 0.1–1.0)
Neutrophils Relative %: 30.9 % — ABNORMAL LOW (ref 43.0–77.0)
Platelets: 182 10*3/uL (ref 150.0–400.0)
RDW: 14 % (ref 11.5–14.6)
WBC: 3.4 10*3/uL — ABNORMAL LOW (ref 4.5–10.5)

## 2010-07-31 NOTE — Op Note (Signed)
NAMEAVAH, BASHOR                 ACCOUNT NO.:  [id number]   MEDICAL RECORD NO.:  [id number]          PATIENT TYPE:  INP   LOCATION:  2550                         FACILITY:  [facility]   PHYSICIAN:  Coletta Memos, M.D.     DATE OF BIRTH:  1955-06-24   DATE OF PROCEDURE:  12/20/2005  DATE OF DISCHARGE:                                 OPERATIVE REPORT   PREOPERATIVE DIAGNOSIS:  1. Cervical spondylosis with myelopathy.  2. Cervical stenosis.  3. Cervical radiculopathy.   POSTOPERATIVE DIAGNOSES:  1. Cervical spondylosis with myelopathy.  2. Cervical stenosis.  3. Cervical radiculopathy.   PROCEDURE:  1. Anterior cervical decompression C4-5, C5-6.  2. Arthrodesis C4-C6 with two 6-mm Alphatec PEEK interbody cages filled      with morselized allograft, DBX the be in bone putty.  3. Anterior instrumentation reflex hybrid 32-mm plate using 57-QI screws.   COMPLICATIONS:  None.   SURGEON:  Coletta Memos, M.D.   INDICATIONS:  Angela Thornton is a 55 year old who presented with a 2-week  history of tingling in the left upper extremity.  An MRI performed on  12/19/2005 showed abnormal cord signal, and compression of the spinal cord  of C5-6 with stenosis of C4-5.  I, therefore, recommended; and the patient  agreed to undergo operative decompression.   OPERATIVE NOTE:  Angela Thornton was brought to the operating room, intubated,  and placed under general anesthetic without difficulty.  Her neck was  prepped; and she was draped in sterile fashion after being moved to the  operating table and her head being placed on a horseshoe headrest.  I  infiltrated 5 mL of 1/2% lidocaine 1:200,000 strength epinephrine, starting  at the midline and extending to the medial border of the left  sternocleidomastoid.  I opened the skin with a #10 blade and took this down  to the platysma.  I then dissected with Metzenbaum scissors in a plane  superior to the platysma rostrally and caudally.  I opened the platysma in  a  horizontal fashion.  Then I dissected inferior to the platysma rostrally and  caudally.  I then dissected an avascular corridor to the cervical spine.  I  placed a spinal needle; and that needle was at the C4-5 disk space.  I then  reflected the longus colli muscles bilaterally; and placed a self-retaining  retractor.  I then placed two distraction pins; one at C5 and the other at  C6.  I then proceeded with the diskectomy.   I decompressed the spinal canal first by performing a diskectomy at C5-6;  and then using the curettes, pituitary rongeurs, and a high-speed drill.  She had a significant amount of osteophytic bone; she also had a thickened  ligament; and then a very very degenerated disk and disk space.  After using  the drill to remove the osteophytes, I was able to get underneath the  posterior longitudinal ligament with a Kerrison punch.  I then removed large  fragments of disk material which extended both rostrally and caudally from  the disk space.  I then removed the rest  of the posterior longitudinal  ligament, and thoroughly decompressed both C6 nerve roots.  I then irrigated  the wound.  I then prepared the endplates for arthrodesis.  I then placed a  6-mm Alphatec PEEK graft filled with morselized allograft in the form of the  DBX bone putty.  I then removed the distraction pin at C6 and placed that  into C4.  I moved the retractor superiorly.  I then distracted that disk  space and started the diskectomy.   Again using curettes, pituitary rongeurs, Kerrison punches and a high-speed  drill.  I removed both the disk and osteophytes until I was able to  decompress the C5 foramina.  I did not do an aggressive decompression of  both sides, but I certainly decompressed the spinal canal at that level.  This level also had a great deal of degenerative change, posterior  osteophytes, and partially calcified posterior longitudinal ligament.  After  thorough and adequate  decompression, I then irrigated.  I then placed  another 6-mm PEEK interbody Alphatec graft filled with DBX bone putty.   I then placed my instrumentation there being a 32-mm plate.  I placed two  screws at C4, two at C5, and two at C6.  Each screw was placed first by  drilling a tap hole, and then using self-tapping screws.  The final site was  used, the plate was in good position.  X-ray showed the plate, plugs, and  screws to be in good position.  I then irrigated the wound and closed the  wound in a layered fashion using Vicryl sutures to reapproximate the  platysma and subcuticular layers.  Dermabond was used for a sterile  dressing.           ______________________________  Coletta Memos, M.D.     KC/MEDQ  D:  12/20/2005  T:  12/20/2005  Job:  [id number]

## 2010-07-31 NOTE — Discharge Summary (Signed)
Angela Thornton, Angela Thornton                 ACCOUNT NO.:  [id number]   MEDICAL RECORD NO.:  [id number]          PATIENT TYPE:  INP   LOCATION:  3022                         FACILITY:  [facility]   PHYSICIAN:  Coletta Memos, M.D.     DATE OF BIRTH:  [birth year]   DATE OF ADMISSION:  12/19/2005  DATE OF DISCHARGE:  12/20/2005                                 DISCHARGE SUMMARY   ADMITTING DIAGNOSES:  1. Cervical myelopathy with cervical spondylosis with myelopathy.  2. Cervical degenerative disk disease with myelopathy.  3. Cervical stenosis cervical radiculopathy.   DISCHARGE DIAGNOSES:  1. Cervical myelopathy with cervical spondylosis with myelopathy.  2. Cervical degenerative disk disease with myelopathy.  3. Cervical stenosis cervical radiculopathy.   PROCEDURE:  Anterior cervical decompression C4-5, C5-6 with anterior plating  and arthrodesis using reflex hybrid 32-mm plate Alphatec PEEK implant 6 mm  at each level.   COMPLICATIONS:  None.   DISCHARGE STATUS:  Alive and well __________ strong.  Would clean and dry.  No signs of infection.  She is tolerating a regular diet, voiding well, and  ambulating without difficulty.   HISTORY:  Mrs. Lucchese is admitted with tingling in the left upper extremity  for approximately 2 weeks.  As a result of this, she underwent an MRI scan  which showed cord compression and abnormal signal behind C5-6.  She was also  markedly stenotic at C4-5.   On examination she was myelopathic.  I, therefore, recommended; and she  agreed to undergo operative decompression.  She was taken to the operating  room and had an uncomplicated procedure.  Postop she has done quite well  without difficulty.  Her wound is clean, __________ dry.  No signs of  infection.  No evidence of symptomatic hematoma.  She will be discharged  home; given hydrocodone 5 mg, acetaminophen 325, and Flexeril.  She was  given instructions of no driving for 10 days.  She is a see me in 3  weeks.           ______________________________  Coletta Memos, M.D.     KC/MEDQ  D:  12/20/2005  T:  12/22/2005  Job:  [id number]

## 2010-07-31 NOTE — H&P (Signed)
Angela Thornton, Angela Thornton                 ACCOUNT NO.:  [id number]   MEDICAL RECORD NO.:  [id number]          PATIENT TYPE:  INP   LOCATION:  2550                         FACILITY:  [facility]   PHYSICIAN:  Coletta Memos, M.D.     DATE OF BIRTH:  1955-07-30   DATE OF ADMISSION:  12/19/2005  DATE OF DISCHARGE:                                HISTORY & PHYSICAL   ADMISSION DIAGNOSES:  1. Cervical spondylosis with myelopathy.  2. Cervical stenosis.  3. Cervical radiculopathy.   CHIEF COMPLAINT:  Numbness in fingers.   INDICATIONS:  Angela Thornton is a 55 year old woman who has had approximately a  2-3 week history of numbness in the third and fourth digits on the left  hand.  She has had this tingling and has had some pain in her left upper  extremity and right upper extremity at times.  She found that if she was  able to press two fingers to the back of her neck, it would relieve her.  She had no symptoms prior to the onset of these problems.  She has not had a  history of neck pain.  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   She underwent an MRI today and the results showed spinal cord compression.  The radiologist then called the primary care physician who then called the  patient and told her to come to the emergency room for a neurosurgical  evaluation.  Angela Thornton has no other problems with that.  She has not  described weakness in the upper extremities.  Most of the numbness has been  only on the left side.  She has not had any problems with her balance or  coordination recently either.  She has had no bowel or bladder dysfunction.   PAST MEDICAL HISTORY:  Significant for hypertension.  The patient has two  daughters, both in good health.  She does not use tobacco.  She does not use  alcohol.  She does not have a history of illicit drug use.   CURRENT MEDICATIONS:  Lisinopril.   REVIEW OF SYSTEMS:  CONSTITUTIONAL:  Denies.  EYE, EAR,  NOSE, THROAT, MOUTH:  Denies.  CARDIOVASCULAR:  Denies.  RESPIRATORY:  Denies:  GASTROINTESTINAL:  Denies.  GENITOURINARY:  Denies.  SKIN:  Denies.  MUSCULOSKELETAL:  Denies.  ENDOCRINE: Denies.  HEMATOLOGIC:  Denies.  ALLERGIC PROBLEMS:  Denies.  SHE  DOES REPORT THE NUMBNESS IN HER FINGERS WHICH HAS BEEN PERSISTENT.   PHYSICAL EXAMINATION:  VITAL SIGNS:  Temperature 97.9, pulse 69, respiratory  rate of 18, oxygen saturation is 100%.  GENERAL:  Angela Thornton is alert, oriented x4, answering all questions  appropriately.  Speech is clear and fluent.  HEENT:  Pupils are equal, round and reactive to light.  Full extraocular  movements are present.  She has a normal funduscopic examination.  She has  symmetric facial sensation and symmetric facial movements.  She has hearing  intact voice bilaterally.  Tongue and uvula elevate in the midline.  EXTREMITIES:  Shoulder shrug is normal.  She  has 5/5 strength in the upper  extremities and the deltoids, biceps, triceps, grip, intrinsics and in the  lower extremities.  She has a normal gait.  Coordination is normal in the  upper and lower extremities.  She has intact light touch and intact  sensation in the upper and lower extremities.  She is hyperreflexic in the  upper extremities with bilateral Hoffmann sign.  No clonus was elicited.  She is mildly hyperreflexic in the lower extremities at 3+.  She has  suprapatellar reflex bilaterally on the right side and adductor reflex in  the lower extremities bilaterally.  NECK:  No cervical masses or bruits.  LUNGS:  Lung fields are clear.  HEART:  Regular rhythm and rate.  No murmurs or rubs.  Pulses are good at  the wrists and feet bilaterally.   MRI shows spondylitic changes and a central disk at C4-5 causing some mild  cord distortion but no CSF is present.  Anterior to the cord on the T2  sequence and at C5-6 where there is increased signal on T2 imaging in the  spinal cord and a large herniated  disk at this level obliterating the CSF  space.  She has mild disk bulge at C3-4 which is causing some effacement of  the thecal sac, though CSF is present.  Plain films show degenerative disk  changes and spondylitic changes present at C5-6 where they are most  prominent.  Some are also present at C4-5 where she has an anterior  osteophyte off the anterior inferior portion of C4.  Alignment is otherwise  normal.   Alignment is otherwise normal.  Paraspinous soft tissue is normal.   DIAGNOSES:  1. Cervical spondylosis with myelopathy.  2. Cervical stenosis.  3. Cervical radiculopathy.   I have explained to Angela Thornton that I believe she should have her cervical  spinal cord decompressed.  This is secondary to the fact that she does have  some evidence of myelopathy although strength is excellent.  She has  abnormal cord signal also.  She is in good health.  I recommended that she  undergo a decompression so that she would not incur any further changes to  the spinal cord and prevent any damage to the spinal cord.  Risks of the  procedure, including bleeding, infection, no relief of paralysis, need for  further surgery, fusion failure, hardware failure, weakness in one or both  arms, weakness in one or both legs, or bowel or bladder dysfunction were  explained.  She understands and wishes to proceed.           ______________________________  Coletta Memos, M.D.     KC/MEDQ  D:  12/20/2005  T:  12/20/2005  Job:  [id number]

## 2010-07-31 NOTE — Assessment & Plan Note (Signed)
[facility]                                   ON-CALL NOTE   YASUKO, LAPAGE                          MRN:          [id number]  DATE:12/19/2005                            DOB:          June 24, 1955    This evening I received a call from Dr. Artist Pais. Dr. Artist Pais states that he received  a call from Dr. Constance Goltz, radiologist, at [facility]. It appears that  Ms. Hastings had an MRI performed and Dr. Constance Goltz noted this evening that she  had compression of the spinal cord at C4-C5 and C6-C7 and she also has some  edema. Given these findings, Dr. Artist Pais wanted me to follow up on her symptoms.  I did call Ms. Cathlean Cower at home and she stated that she was having neck pain  and numbness/tingling going down into her third and fourth left digits.   PLAN:  Given these findings and the MRI results, I did advise the patient to  go to the emergency department. I spoke to Dr. Freida Busman in the emergency  department and advised him of the findings on the MRI as well as the  patient's symptoms. He states that they would be happy to see her. I called  Ms. Shutters back and advised her that I spoke to Dr. Freida Busman and she should  pass that information on to the triage nurse when she arrives. The patient  expressed understanding.            ______________________________  Leanne Chang, M.D.      LA/MedQ  DD:  12/19/2005  DT:  12/20/2005  Job #:  [id number]   cc:   Arta Silence, MD

## 2010-08-20 ENCOUNTER — Other Ambulatory Visit: Payer: Self-pay | Admitting: *Deleted

## 2010-08-20 DIAGNOSIS — D7289 Other specified disorders of white blood cells: Secondary | ICD-10-CM

## 2010-08-21 ENCOUNTER — Other Ambulatory Visit (INDEPENDENT_AMBULATORY_CARE_PROVIDER_SITE_OTHER): Payer: BC Managed Care – PPO

## 2010-08-21 ENCOUNTER — Telehealth: Payer: Self-pay | Admitting: Gastroenterology

## 2010-08-21 DIAGNOSIS — D7289 Other specified disorders of white blood cells: Secondary | ICD-10-CM

## 2010-08-21 LAB — CBC WITH DIFFERENTIAL/PLATELET
Basophils Relative: 0.5 % (ref 0.0–3.0)
Eosinophils Relative: 2.4 % (ref 0.0–5.0)
Lymphocytes Relative: 41.6 % (ref 12.0–46.0)
MCV: 93.1 fl (ref 78.0–100.0)
Monocytes Absolute: 0.6 10*3/uL (ref 0.1–1.0)
Neutrophils Relative %: 40.1 % — ABNORMAL LOW (ref 43.0–77.0)
Platelets: 173 10*3/uL (ref 150.0–400.0)
RBC: 3.79 Mil/uL — ABNORMAL LOW (ref 3.87–5.11)
WBC: 3.6 10*3/uL — ABNORMAL LOW (ref 4.5–10.5)

## 2010-08-21 NOTE — Telephone Encounter (Signed)
Pt called and said she has refills on her Nexium but the pharmacy states the insurance is denying her. Told her to contact the pharmacy and fax [place] the denil and come and pick up samples. Pt will be here today  For Nexium samples because she is out of her med

## 2010-08-21 NOTE — Progress Notes (Signed)
Labs only

## 2010-08-24 ENCOUNTER — Other Ambulatory Visit: Payer: Self-pay | Admitting: Gastroenterology

## 2010-08-26 NOTE — Telephone Encounter (Signed)
Pts insurance will not cover any more medication until October.  Offered the pt Nexium samples

## 2010-08-27 ENCOUNTER — Telehealth: Payer: Self-pay | Admitting: *Deleted

## 2010-08-27 NOTE — Telephone Encounter (Signed)
Pts Nexium approved from 08/06/2010 until 08/27/2011. Pt informed

## 2010-11-09 ENCOUNTER — Encounter: Payer: Self-pay | Admitting: Family Medicine

## 2010-11-19 ENCOUNTER — Ambulatory Visit (AMBULATORY_SURGERY_CENTER): Payer: BC Managed Care – PPO | Admitting: *Deleted

## 2010-11-19 ENCOUNTER — Telehealth: Payer: Self-pay | Admitting: *Deleted

## 2010-11-19 VITALS — Ht 60.0 in | Wt 143.0 lb

## 2010-11-19 DIAGNOSIS — D126 Benign neoplasm of colon, unspecified: Secondary | ICD-10-CM

## 2010-11-19 DIAGNOSIS — Z1211 Encounter for screening for malignant neoplasm of colon: Secondary | ICD-10-CM

## 2010-11-19 MED ORDER — SUPREP BOWEL PREP KIT 17.5-3.13-1.6 GM/177ML PO SOLN: 1.0000 | ORAL | Status: DC

## 2010-11-19 NOTE — Telephone Encounter (Signed)
Advised pt. To stop Iron 7 days prior to procedure.

## 2010-11-19 NOTE — Progress Notes (Signed)
Pt. Instructed to hold Iron supplement 7 days prior to colonoscopy.

## 2010-11-20 ENCOUNTER — Encounter: Payer: Self-pay | Admitting: Gastroenterology

## 2010-12-03 ENCOUNTER — Ambulatory Visit (AMBULATORY_SURGERY_CENTER): Payer: BC Managed Care – PPO | Admitting: Gastroenterology

## 2010-12-03 ENCOUNTER — Encounter: Payer: Self-pay | Admitting: Gastroenterology

## 2010-12-03 VITALS — BP 108/69 | HR 59 | Temp 97.4°F | Resp 18 | Ht 60.0 in | Wt 143.0 lb

## 2010-12-03 DIAGNOSIS — D126 Benign neoplasm of colon, unspecified: Secondary | ICD-10-CM

## 2010-12-03 DIAGNOSIS — Z1211 Encounter for screening for malignant neoplasm of colon: Secondary | ICD-10-CM

## 2010-12-03 DIAGNOSIS — Z86010 Personal history of colonic polyps: Secondary | ICD-10-CM

## 2010-12-03 MED ORDER — SODIUM CHLORIDE 0.9 % IV SOLN: 500.0000 mL | INTRAVENOUS | Status: DC

## 2010-12-03 NOTE — Patient Instructions (Signed)
FOLLOW DISCHARGE INSTRUCTIONS (BLUE & GREEN SHEETS)   INFORMATION GIVEN ON POLYPS. YOU WILL RECEIVE LETTER FROM DR. KAPLAN TELLING YOU WHEN TO REPEAT COLONOSCOPY BASED ON PATHOLOGY RESULTS ON POLYP REMOVED TODAY

## 2010-12-04 ENCOUNTER — Telehealth: Payer: Self-pay | Admitting: *Deleted

## 2010-12-04 NOTE — Telephone Encounter (Signed)

## 2010-12-12 ENCOUNTER — Other Ambulatory Visit: Payer: Self-pay | Admitting: Gastroenterology

## 2010-12-17 ENCOUNTER — Other Ambulatory Visit: Payer: Self-pay | Admitting: Gastroenterology

## 2011-04-23 ENCOUNTER — Ambulatory Visit: Payer: BC Managed Care – PPO | Admitting: Family Medicine

## 2011-05-07 ENCOUNTER — Encounter: Payer: Self-pay | Admitting: Family Medicine

## 2011-05-07 ENCOUNTER — Encounter: Payer: BC Managed Care – PPO | Admitting: Family Medicine

## 2011-05-07 ENCOUNTER — Ambulatory Visit (INDEPENDENT_AMBULATORY_CARE_PROVIDER_SITE_OTHER): Payer: BC Managed Care – PPO | Admitting: Family Medicine

## 2011-05-07 VITALS — BP 122/84 | HR 78 | Temp 98.4°F | Ht 62.0 in | Wt 139.6 lb

## 2011-05-07 DIAGNOSIS — I10 Essential (primary) hypertension: Secondary | ICD-10-CM

## 2011-05-07 DIAGNOSIS — J309 Allergic rhinitis, unspecified: Secondary | ICD-10-CM

## 2011-05-07 DIAGNOSIS — Z0000 Encounter for general adult medical examination without abnormal findings: Secondary | ICD-10-CM

## 2011-05-07 DIAGNOSIS — R319 Hematuria, unspecified: Secondary | ICD-10-CM

## 2011-05-07 LAB — BASIC METABOLIC PANEL
BUN: 11 mg/dL (ref 6–23)
Chloride: 99 mEq/L (ref 96–112)
GFR: 78.06 mL/min (ref >60.00)
Glucose, Bld: 88 mg/dL (ref 70–99)
Potassium: 3.5 mEq/L (ref 3.5–5.1)
Sodium: 138 mEq/L (ref 135–145)

## 2011-05-07 LAB — HEPATIC FUNCTION PANEL
ALT: 12 U/L (ref 0–35)
AST: 22 U/L (ref 0–37)
Albumin: 4.5 g/dL (ref 3.5–5.2)
Total Protein: 8.3 g/dL (ref 6.0–8.3)

## 2011-05-07 LAB — TSH: TSH: 0.54 u[IU]/mL (ref 0.35–5.50)

## 2011-05-07 LAB — POCT URINALYSIS DIPSTICK
Bilirubin, UA: NEGATIVE
Glucose, UA: NEGATIVE
Ketones, UA: NEGATIVE
Leukocytes, UA: NEGATIVE
Spec Grav, UA: <=1.005

## 2011-05-07 LAB — LIPID PANEL: Cholesterol: 176 mg/dL (ref 0–200)

## 2011-05-07 LAB — CBC WITH DIFFERENTIAL/PLATELET
Basophils Absolute: 0 10*3/uL (ref 0.0–0.1)
Basophils Relative: 0.7 % (ref 0.0–3.0)
Eosinophils Relative: 2 % (ref 0.0–5.0)
HCT: 36.2 % (ref 36.0–46.0)
Hemoglobin: 11.9 g/dL — ABNORMAL LOW (ref 12.0–15.0)
Lymphs Abs: 1.4 10*3/uL (ref 0.7–4.0)
Monocytes Relative: 12 % (ref 3.0–12.0)
Neutro Abs: 1.5 10*3/uL (ref 1.4–7.7)
RDW: 14.1 % (ref 11.5–14.6)

## 2011-05-07 MED ORDER — FLUTICASONE PROPIONATE 50 MCG/ACT NA SUSP: NASAL | Status: DC

## 2011-05-07 MED ORDER — LISINOPRIL-HYDROCHLOROTHIAZIDE 20-25 MG PO TABS: 1.0000 | ORAL_TABLET | Freq: Every day | ORAL | Status: DC

## 2011-05-07 NOTE — Patient Instructions (Signed)

## 2011-05-07 NOTE — Progress Notes (Signed)
Subjective:     Angela Thornton is a 56 y.o. female and is here for a comprehensive physical exam. The patient reports problems - fatigue and hand pain in am.  History   Social History  . Marital Status: Divorced    Spouse Name: N/A    Number of Children: N/A  . Years of Education: N/A   Occupational History  . 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 Smoking status: Never Smoker   . Smokeless tobacco: Never Used  . Alcohol Use: 0.6 oz/week    1 Glasses of wine per week  . Drug Use: No  . Sexually Active: Yes -- Female partner(s)   Other Topics Concern  . Not on file   Social History Narrative  . No narrative on file   Health Maintenance  Topic Date Due  . Influenza Vaccine  12/14/2011  . Mammogram  10/26/2012  . Pap Smear  05/06/2013  . Colonoscopy  12/03/2015  . Tetanus/tdap  02/13/2019    The following portions of the patient's history were reviewed and updated as appropriate: allergies, current medications, past family history, past medical history, past social history, past surgical history and problem list.  Review of Systems Review of Systems  Constitutional: Negative for activity change, appetite change and fatigue.  HENT: Negative for hearing loss, congestion, tinnitus and ear discharge.  dentist q75m Eyes: Negative for visual disturbance (see optho q1y -- vision corrected to 20/20 with glasses).  Respiratory: Negative for cough, chest tightness and shortness of breath.   Cardiovascular: Negative for chest pain, palpitations and leg swelling.  Gastrointestinal: Negative for abdominal pain, diarrhea, constipation and abdominal distention.  Genitourinary: Negative for urgency, frequency, decreased urine volume and difficulty urinating.  Musculoskeletal: Negative for back pain, arthralgias and gait problem.  Skin: Negative for color change, pallor and rash.  Neurological: Negative for dizziness, light-headedness, numbness and headaches.    Hematological: Negative for adenopathy. Does not bruise/bleed easily.  Psychiatric/Behavioral: Negative for suicidal ideas, confusion, sleep disturbance, self-injury, dysphoric mood, decreased concentration and agitation.       Objective:    BP 122/84  Pulse 78  Temp(Src) 98.4 F (36.9 C) (Oral)  Ht 5\' 2"  (1.575 m)  Wt 139 lb 9.6 oz (63.322 kg)  BMI 25.53 kg/m2  SpO2 98% General appearance: alert, cooperative, appears stated age and no distress Head: Normocephalic, without obvious abnormality, atraumatic Eyes: conjunctivae/corneas clear. PERRL, EOM's intact. Fundi benign. Ears: normal TM's and external ear canals both ears Nose: Nares normal. Septum midline. Mucosa normal. No drainage or sinus tenderness. Throat: lips, mucosa, and tongue normal; teeth and gums normal Neck: no adenopathy, no carotid bruit, no JVD, supple, symmetrical, trachea midline and thyroid not enlarged, symmetric, no tenderness/mass/nodules Back: symmetric, no curvature. ROM normal. No CVA tenderness. Lungs: clear to auscultation bilaterally Breasts: gyn Heart: regular rate and rhythm, S1, S2 normal, no murmur, click, rub or gallop Abdomen: soft, non-tender; bowel sounds normal; no masses,  no organomegaly Pelvic: gyn Extremities: extremities normal, atraumatic, no cyanosis or edema Pulses: 2+ and symmetric Skin: Skin color, texture, turgor normal. No rashes or lesions--cyst ,  R buttock Lymph nodes: Cervical, supraclavicular, and axillary nodes normal. Neurologic: Alert and oriented X 3, normal strength and tone. Normal symmetric reflexes. Normal coordination and gait psych--no anxiety or depression    Assessment:    Healthy female exam.  htn--- stable, con't meds gerd-- con't meds     Plan:  ghm utd Check labs  See After Visit Summary for Counseling Recommendations

## 2011-05-09 LAB — URINE CULTURE: Colony Count: 10000

## 2011-05-13 ENCOUNTER — Other Ambulatory Visit: Payer: BC Managed Care – PPO

## 2011-05-13 LAB — URINALYSIS, ROUTINE W REFLEX MICROSCOPIC
Bilirubin Urine: NEGATIVE
Total Protein, Urine: NEGATIVE
Urine Glucose: NEGATIVE
pH: 6 (ref 5.0–8.0)

## 2011-05-28 ENCOUNTER — Other Ambulatory Visit: Payer: BC Managed Care – PPO

## 2011-07-19 ENCOUNTER — Telehealth: Payer: Self-pay | Admitting: *Deleted

## 2011-07-19 NOTE — Telephone Encounter (Signed)
Received phone call from [facility] from [employer], fax number [phone] wants to have pt EKG, Stress Test, labs and office notes faxed to them per mutual pt

## 2011-07-19 NOTE — Telephone Encounter (Signed)
Faxed.   KP 

## 2011-08-23 ENCOUNTER — Other Ambulatory Visit: Payer: Self-pay | Admitting: Gastroenterology

## 2011-08-24 MED ORDER — ESOMEPRAZOLE MAGNESIUM 40 MG PO CPDR: 40.0000 mg | DELAYED_RELEASE_CAPSULE | Freq: Every day | ORAL | Status: DC

## 2011-08-24 NOTE — Telephone Encounter (Signed)
Spoke with patient.

## 2011-09-02 ENCOUNTER — Other Ambulatory Visit: Payer: Self-pay | Admitting: *Deleted

## 2011-09-02 MED ORDER — ESOMEPRAZOLE MAGNESIUM 40 MG PO CPDR: 40.0000 mg | DELAYED_RELEASE_CAPSULE | Freq: Every day | ORAL | Status: DC

## 2011-09-06 ENCOUNTER — Telehealth: Payer: Self-pay | Admitting: Gastroenterology

## 2011-09-06 NOTE — Telephone Encounter (Signed)
Nexium Approved for 1 year  #[id number]

## 2011-12-09 ENCOUNTER — Encounter: Payer: Self-pay | Admitting: Family Medicine

## 2012-05-17 ENCOUNTER — Other Ambulatory Visit: Payer: Self-pay | Admitting: Family Medicine

## 2012-06-16 DIAGNOSIS — Z0279 Encounter for issue of other medical certificate: Secondary | ICD-10-CM

## 2012-08-15 ENCOUNTER — Other Ambulatory Visit: Payer: Self-pay | Admitting: Family Medicine

## 2013-02-02 ENCOUNTER — Other Ambulatory Visit: Payer: Self-pay | Admitting: Family Medicine

## 2014-10-16 ENCOUNTER — Encounter: Payer: Self-pay | Admitting: Gastroenterology

## 2020-11-26 ENCOUNTER — Emergency Department (HOSPITAL_BASED_OUTPATIENT_CLINIC_OR_DEPARTMENT_OTHER): Payer: BC Managed Care – PPO

## 2020-11-26 ENCOUNTER — Encounter (HOSPITAL_BASED_OUTPATIENT_CLINIC_OR_DEPARTMENT_OTHER): Payer: Self-pay | Admitting: *Deleted

## 2020-11-26 ENCOUNTER — Inpatient Hospital Stay (HOSPITAL_BASED_OUTPATIENT_CLINIC_OR_DEPARTMENT_OTHER)
Admission: EM | Admit: 2020-11-26 | Discharge: 2020-11-28 | DRG: 390 | Disposition: A | Discharge status: Home / Self Care | Payer: BC Managed Care – PPO | Attending: Internal Medicine | Admitting: Internal Medicine

## 2020-11-26 ENCOUNTER — Other Ambulatory Visit: Payer: Self-pay

## 2020-11-26 DIAGNOSIS — Z79899 Other long term (current) drug therapy: Secondary | ICD-10-CM

## 2020-11-26 DIAGNOSIS — Z981 Arthrodesis status: Secondary | ICD-10-CM

## 2020-11-26 DIAGNOSIS — K219 Gastro-esophageal reflux disease without esophagitis: Secondary | ICD-10-CM | POA: Diagnosis present

## 2020-11-26 DIAGNOSIS — K56609 Unspecified intestinal obstruction, unspecified as to partial versus complete obstruction: Principal | ICD-10-CM | POA: Diagnosis present

## 2020-11-26 DIAGNOSIS — N200 Calculus of kidney: Secondary | ICD-10-CM | POA: Diagnosis present

## 2020-11-26 DIAGNOSIS — I10 Essential (primary) hypertension: Secondary | ICD-10-CM | POA: Diagnosis present

## 2020-11-26 DIAGNOSIS — Z20822 Contact with and (suspected) exposure to covid-19: Secondary | ICD-10-CM | POA: Diagnosis present

## 2020-11-26 DIAGNOSIS — Z8249 Family history of ischemic heart disease and other diseases of the circulatory system: Secondary | ICD-10-CM

## 2020-11-26 DIAGNOSIS — R8271 Bacteriuria: Secondary | ICD-10-CM | POA: Diagnosis present

## 2020-11-26 HISTORY — DX: Unspecified intestinal obstruction, unspecified as to partial versus complete obstruction: K56.609

## 2020-11-26 LAB — URINALYSIS, ROUTINE W REFLEX MICROSCOPIC
Bilirubin Urine: NEGATIVE
Glucose, UA: NEGATIVE mg/dL
Ketones, ur: NEGATIVE mg/dL
Leukocytes,Ua: NEGATIVE
Nitrite: NEGATIVE
Protein, ur: NEGATIVE mg/dL
Specific Gravity, Urine: 1.03 (ref 1.005–1.030)
pH: 6 (ref 5.0–8.0)

## 2020-11-26 LAB — CBC WITH DIFFERENTIAL/PLATELET
Abs Immature Granulocytes: 0.05 10*3/uL (ref 0.00–0.07)
Basophils Absolute: 0 10*3/uL (ref 0.0–0.1)
Basophils Relative: 0 %
Eosinophils Absolute: 0 10*3/uL (ref 0.0–0.5)
Eosinophils Relative: 0 %
HCT: 43.5 % (ref 36.0–46.0)
Hemoglobin: 14.5 g/dL (ref 12.0–15.0)
Immature Granulocytes: 1 %
Lymphocytes Relative: 15 %
Lymphs Abs: 1.3 10*3/uL (ref 0.7–4.0)
MCH: 30.2 pg (ref 26.0–34.0)
MCHC: 33.3 g/dL (ref 30.0–36.0)
MCV: 90.6 fL (ref 80.0–100.0)
Monocytes Absolute: 0.6 10*3/uL (ref 0.1–1.0)
Monocytes Relative: 7 %
Neutro Abs: 6.8 10*3/uL (ref 1.7–7.7)
Neutrophils Relative %: 77 %
Platelets: 247 10*3/uL (ref 150–400)
RBC: 4.8 MIL/uL (ref 3.87–5.11)
RDW: 14.4 % (ref 11.5–15.5)
WBC: 8.8 10*3/uL (ref 4.0–10.5)
nRBC: 0 % (ref 0.0–0.2)

## 2020-11-26 LAB — COMPREHENSIVE METABOLIC PANEL
ALT: 12 U/L (ref 0–44)
AST: 17 U/L (ref 15–41)
Albumin: 4.3 g/dL (ref 3.5–5.0)
Alkaline Phosphatase: 63 U/L (ref 38–126)
Anion gap: 9 (ref 5–15)
BUN: 21 mg/dL (ref 8–23)
CO2: 29 mmol/L (ref 22–32)
Calcium: 10 mg/dL (ref 8.9–10.3)
Chloride: 99 mmol/L (ref 98–111)
Creatinine, Ser: 0.69 mg/dL (ref 0.44–1.00)
GFR, Estimated: >60 mL/min (ref >60)
Glucose, Bld: 119 mg/dL — ABNORMAL HIGH (ref 70–99)
Potassium: 4.1 mmol/L (ref 3.5–5.1)
Sodium: 137 mmol/L (ref 135–145)
Total Bilirubin: 0.7 mg/dL (ref 0.3–1.2)
Total Protein: 8.1 g/dL (ref 6.5–8.1)

## 2020-11-26 LAB — RESP PANEL BY RT-PCR (FLU A&B, COVID) ARPGX2
Influenza A by PCR: NEGATIVE
Influenza B by PCR: NEGATIVE
SARS Coronavirus 2 by RT PCR: NEGATIVE

## 2020-11-26 LAB — URINALYSIS, MICROSCOPIC (REFLEX)

## 2020-11-26 LAB — CBG MONITORING, ED: Glucose-Capillary: 87 mg/dL (ref 70–99)

## 2020-11-26 LAB — PROTIME-INR
INR: 0.9 (ref 0.8–1.2)
Prothrombin Time: 12.3 seconds (ref 11.4–15.2)

## 2020-11-26 LAB — TROPONIN I (HIGH SENSITIVITY): Troponin I (High Sensitivity): 3 ng/L (ref <18)

## 2020-11-26 LAB — LIPASE, BLOOD: Lipase: 65 U/L — ABNORMAL HIGH (ref 11–51)

## 2020-11-26 MED ORDER — MORPHINE SULFATE (PF) 4 MG/ML IV SOLN
4.0000 mg | Freq: Once | INTRAVENOUS | Status: AC
  Administered 2020-11-27: 4 mg via INTRAVENOUS
  Filled 2020-11-26: qty 1

## 2020-11-26 MED ORDER — BENZOCAINE 20 % MT AERO
INHALATION_SPRAY | Freq: Once | OROMUCOSAL | Status: AC
  Filled 2020-11-26: qty 57

## 2020-11-26 MED ORDER — MORPHINE SULFATE (PF) 4 MG/ML IV SOLN
4.0000 mg | Freq: Once | INTRAVENOUS | Status: AC
  Administered 2020-11-26: 4 mg via INTRAVENOUS
  Filled 2020-11-26: qty 1

## 2020-11-26 MED ORDER — ONDANSETRON HCL 4 MG/2ML IJ SOLN
4.0000 mg | Freq: Once | INTRAMUSCULAR | Status: AC
  Administered 2020-11-26: 4 mg via INTRAVENOUS
  Filled 2020-11-26: qty 2

## 2020-11-26 MED ORDER — IOHEXOL 350 MG/ML SOLN
85.0000 mL | Freq: Once | INTRAVENOUS | Status: AC | PRN
  Administered 2020-11-26: 85 mL via INTRAVENOUS

## 2020-11-26 MED ORDER — LACTATED RINGERS IV BOLUS
1000.0000 mL | Freq: Once | INTRAVENOUS | Status: AC
  Administered 2020-11-26: 1000 mL via INTRAVENOUS

## 2020-11-26 MED ORDER — LIDOCAINE HCL URETHRAL/MUCOSAL 2 % EX GEL
1.0000 "application " | Freq: Once | CUTANEOUS | Status: AC
  Administered 2020-11-26: 1 via TOPICAL
  Filled 2020-11-26: qty 11

## 2020-11-26 MED ORDER — ONDANSETRON HCL 4 MG/2ML IJ SOLN
4.0000 mg | Freq: Once | INTRAMUSCULAR | Status: AC
  Administered 2020-11-27: 4 mg via INTRAVENOUS
  Filled 2020-11-26: qty 2

## 2020-11-26 NOTE — ED Provider Notes (Signed)
[facility] EMERGENCY DEPARTMENT Provider Note   CSN: [id number] Arrival date & time: 11/26/20  1636     History Chief Complaint  Patient presents with   Abdominal Pain    Angela Thornton is a 65 y.o. female.   Abdominal Pain Associated symptoms: nausea and vomiting   Associated symptoms: no chest pain, no chills, no constipation, no cough, no dysuria, no fever, no hematuria, no shortness of breath and no sore throat   Patient presents for abdominal pain.  Onset was 6 AM.  She has history of multiple small bowel obstructions that were managed with supportive care.  She states that her current abdominal pain feels similar.  She has had 2 bowel movements today.  She has had nausea and vomiting today.  Location of maximal pain is in the area of right upper quadrant.  She does still have a gallbladder.  She denies any symptoms prior to this morning.  She has not had any fevers, chills, urine symptoms, chest pain, or shortness of breath.  She denies any known cardiac history.  While awaiting ED bed, she did have an episode of lightheadedness and diaphoresis.  This has since resolved.    Past Medical History:  Diagnosis Date   Allergy    seasonal   Anemia    GERD (gastroesophageal reflux disease)    Hypertension    SBO ([facility] bowel obstruction) [facility])     Patient Active Problem List   Diagnosis Date Noted   SBO (small bowel obstruction) ([place]) 11/26/2020   LYMPHOPENIA 06/01/2010   SINUSITIS - ACUTE-NOS 07/21/2009   BACK PAIN 06/16/2009   HEMATURIA, HX OF 06/16/2009   VITAMIN B12 DEFICIENCY 05/27/2009   STRICTURE AND STENOSIS OF ESOPHAGUS 05/27/2009   PERSONAL HISTORY OF COLONIC POLYPS 05/27/2009   GUAIAC POSITIVE STOOL 05/12/2009   OTHER ANXIETY STATES 03/10/2009   SEBORRHEIC KERATOSIS 10/31/2008   VAGINITIS 09/09/2008   UTI 08/30/2008   Benign neoplasm of skin, site unspecified 12/12/2007   CHEST PAIN UNSPECIFIED 09/22/2007   GERD (gastroesophageal reflux disease)  08/28/2007   PLANTAR FASCIITIS, RIGHT 04/06/2007   UPPER RESPIRATORY INFECTION 08/17/2006   DIZZINESS 08/17/2006   ANEMIA-NOS 07/21/2006   Essential hypertension 07/21/2006   ARM PAIN, RIGHT 07/21/2006    Past Surgical History:  Procedure Laterality Date   CERVICAL FUSION  2007   C4-5-6 and fractured neck   [facility]     OB History   No obstetric history on file.     Family History  Problem Relation Age of Onset   Hypertension Mother    Alzheimer's disease Father    Hypertension Father    Heart disease Father        cabg x4   Arthritis Father    Hypertension Maternal Grandmother    Arthritis Maternal Grandmother    Hypertension Maternal Grandfather    Arthritis Maternal Grandfather    Hypertension Paternal Grandmother    Hypertension Paternal Grandfather     Social History   Tobacco Use   Smoking status: Never   Smokeless tobacco: Never  Substance Use Topics   Alcohol use: Yes    Alcohol/week: 1.0 standard drink    Types: 1 Glasses of wine per week   Drug use: No    Home Medications Prior to Admission medications   Medication Sig Start Date End Date Taking? Authorizing Provider  lisinopril-hydrochlorothiazide (PRINZIDE,ZESTORETIC) 20-25 MG per tablet  TAKE 1 TABLET BY MOUTH EVERY DAY ( DOCTOR WOULD LIKE TO SEE YOU FOR OFFICE VISIT) 08/15/12  Yes Ann Held, DO  Multiple Vitamins-Minerals (MULTIVITAMIN WITH MINERALS) tablet Take 1 tablet by mouth daily.     Yes [provider]  PARoxetine (PAXIL) 20 MG tablet Take 20 mg by mouth daily. 10/07/20  Yes [provider]  esomeprazole (NEXIUM) 40 MG capsule Take 1 capsule (40 mg total) by mouth daily before breakfast. Patient not taking: Reported on 11/27/2020 09/02/11   Inda Castle, MD  Ferrous Sulfate (SLOW RELEASE IRON PO) Take 1 tablet by mouth daily.   Patient not taking: Reported on 11/27/2020    [provider]  fluticasone (FLONASE) 50 MCG/ACT nasal spray INHALE 2 SPRAYS IN EACH NOSTRIL EVERY DAY Patient not taking: Reported on 11/27/2020 05/17/12   Ann Held, DO    Allergies    Ciprofloxacin and Sulfa antibiotics  Review of Systems   Review of Systems  Constitutional:  Positive for diaphoresis. Negative for chills and fever.  HENT:  Negative for ear pain and sore throat.   Eyes:  Negative for pain and visual disturbance.  Respiratory:  Negative for cough, chest tightness, shortness of breath and wheezing.   Cardiovascular:  Negative for chest pain and palpitations.  Gastrointestinal:  Positive for abdominal pain, nausea and vomiting. Negative for constipation.  Genitourinary:  Negative for dysuria, flank pain, hematuria and pelvic pain.  Musculoskeletal:  Negative for arthralgias, back pain, myalgias and neck pain.  Skin:  Negative for color change and rash.  Neurological:  Positive for light-headedness. Negative for seizures, syncope, weakness, numbness and headaches.  Hematological:  Does not bruise/bleed easily.  Psychiatric/Behavioral:  Negative for confusion and decreased concentration.   All other systems reviewed and are negative.  Physical Exam Updated Vital Signs BP (!) 153/78 (BP Location: Left Arm)   Pulse 71   Temp 97.9 F (36.6 C) (Oral)   Resp 16   Ht 5' (1.524 m)   Wt 62 kg   SpO2 98%   BMI 26.69 kg/m   Physical Exam Vitals and nursing note reviewed.  Constitutional:      General: She is not in acute distress.    Appearance: She is well-developed. She is not ill-appearing, toxic-appearing or diaphoretic.  HENT:     Head: Normocephalic and atraumatic.     Mouth/Throat:     Mouth: Mucous membranes are moist.     Pharynx: Oropharynx is clear.  Eyes:     Extraocular Movements: Extraocular movements intact.     Conjunctiva/sclera: Conjunctivae normal.  Cardiovascular:     Rate and Rhythm: Normal rate and regular rhythm.     Heart sounds: No murmur  heard. Pulmonary:     Effort: Pulmonary effort is normal. No respiratory distress.     Breath sounds: Normal breath sounds. No wheezing or rales.  Chest:     Chest wall: No tenderness.  Abdominal:     Palpations: Abdomen is soft.     Tenderness: There is abdominal tenderness in the right upper quadrant and epigastric area. There is no right CVA tenderness, left CVA tenderness, guarding or rebound.  Musculoskeletal:     Cervical back: Neck supple.  Skin:    General: Skin is warm and dry.     Coloration: Skin is not jaundiced or pale.  Neurological:     General: No focal deficit present.     Mental Status: She is alert and oriented to  person, place, and time.     Cranial Nerves: No cranial nerve deficit.     Motor: No weakness.  Psychiatric:        Mood and Affect: Mood normal.        Behavior: Behavior normal.    ED Results / Procedures / Treatments   Labs (all labs ordered are listed, but only abnormal results are displayed) Labs Reviewed  URINALYSIS, ROUTINE W REFLEX MICROSCOPIC - Abnormal; Notable for the following components:      Result Value   Color, Urine AMBER (*)    Hgb urine dipstick MODERATE (*)    All other components within normal limits  URINALYSIS, MICROSCOPIC (REFLEX) - Abnormal; Notable for the following components:   Bacteria, UA FEW (*)    All other components within normal limits  COMPREHENSIVE METABOLIC PANEL - Abnormal; Notable for the following components:   Glucose, Bld 119 (*)    All other components within normal limits  LIPASE, BLOOD - Abnormal; Notable for the following components:   Lipase 65 (*)    All other components within normal limits  BASIC METABOLIC PANEL - Abnormal; Notable for the following components:   Glucose, Bld 106 (*)    All other components within normal limits  RESP PANEL BY RT-PCR (FLU A&B, COVID) ARPGX2  SURGICAL PCR SCREEN  CBC WITH DIFFERENTIAL/PLATELET  PROTIME-INR  HIV ANTIBODY (ROUTINE TESTING W REFLEX)  CBC   MAGNESIUM  CBG MONITORING, ED  TROPONIN I (HIGH SENSITIVITY)    EKG None  Radiology DG Abdomen 1 View  Result Date: 11/26/2020 CLINICAL DATA:  Nasogastric tube placement. EXAM: ABDOMEN - 1 VIEW COMPARISON:  CT abdomen and pelvis 11/26/2020. FINDINGS: Nasogastric tube tip is in the body of the stomach with side hole just below the level of the gastroesophageal junction. Dilated small bowel in the upper abdomen appears unchanged. Visualized lung bases are clear. Osseous structures are within normal limits. IMPRESSION: 1. Nasogastric tube tip in the body of the stomach. 2. Dilated small bowel compatible small bowel obstruction. Electronically Signed   By: Ronney Asters M.D.   On: 11/26/2020 22:52   CT ABDOMEN PELVIS W CONTRAST  Result Date: 11/26/2020 CLINICAL DATA:  Nausea and vomiting, evaluate for bowel obstruction. EXAM: CT ABDOMEN AND PELVIS WITH CONTRAST TECHNIQUE: Multidetector CT imaging of the abdomen and pelvis was performed using the standard protocol following bolus administration of intravenous contrast. CONTRAST:  34m OMNIPAQUE IOHEXOL 350 MG/ML SOLN COMPARISON:  CT abdomen and pelvis 09/24/2009. FINDINGS: Lower chest: No acute abnormality. Hepatobiliary: Small gallstones are present. There is no biliary ductal dilatation. The liver appears within normal limits. Pancreas: Unremarkable. No pancreatic ductal dilatation or surrounding inflammatory changes. Spleen: Normal in size without focal abnormality. Adrenals/Urinary Tract: There are likely punctate nonobstructing bilateral renal calculi. Kidneys and bladder are otherwise within normal limits. Stomach/Bowel: There are dilated fluid-filled mid and proximal small bowel loops containing air-fluid levels. Stomach is also dilated with air-fluid level. Transition point is seen along the anterior lower abdominal wall image 2/55. Small bowel loops are completely decompressed distal to this point. Colon is nondilated. Appendix is within normal  limits. There is no pneumatosis or free air. There is mild mesenteric edema. Vascular/Lymphatic: Aortic atherosclerosis. No enlarged abdominal or pelvic lymph nodes. Reproductive: Calcified uterine fibroids are present which have increased in number when compared to the prior examination. Largest fibroid measures 4.3 cm. Adnexa are unremarkable. Other: There is no ascites. There is no focal abdominal wall hernia. Within the retroperitoneum at  and below the level of the left kidney there is a lobulated fluid attenuation area without enhancement. This was seen on the prior examination but has increased in size when compared to the prior study. Overall measurements are approximately 2.0 x 3.8 by 5.2 cm. Musculoskeletal: No acute or significant osseous findings. IMPRESSION: 1. Findings compatible with this he small bowel obstruction. Transition point is seen along the anterior abdominal wall which may be secondary to adhesions. There is mild mesenteric edema. No free air. 2. Nonobstructing bilateral renal calculi. 3. Fibroid uterus. 4. Cystic mass in the left retroperitoneum has increased in size compared to 2011. This is indeterminate and may represent lymphatic malformation/lymphocele. Electronically Signed   By: Ronney Asters M.D.   On: 11/26/2020 20:31    Procedures Procedures   Medications Ordered in ED Medications  enoxaparin (LOVENOX) injection 40 mg (40 mg Subcutaneous Given 11/27/20 1049)  acetaminophen (TYLENOL) 160 MG/5ML solution 650 mg (has no administration in time range)    Or  acetaminophen (TYLENOL) suppository 650 mg (has no administration in time range)  ondansetron (ZOFRAN) tablet 4 mg (has no administration in time range)    Or  ondansetron (ZOFRAN) injection 4 mg (has no administration in time range)  morphine 2 MG/ML injection 2 mg (2 mg Intravenous Given 11/27/20 1213)  pantoprazole (PROTONIX) injection 40 mg (40 mg Intravenous Given 11/27/20 0233)  hydrALAZINE (APRESOLINE) injection  10 mg (has no administration in time range)  dextrose 5 %-0.45 % sodium chloride infusion ( Intravenous New Bag/Given 11/27/20 1000)  senna-docusate (Senokot-S) tablet 1 tablet (has no administration in time range)  oxyCODONE (Oxy IR/ROXICODONE) immediate release tablet 5 mg (has no administration in time range)  metoprolol tartrate (LOPRESSOR) injection 5 mg (has no administration in time range)  lactated ringers bolus 1,000 mL ( Intravenous Stopped 11/26/20 2018)  ondansetron (ZOFRAN) injection 4 mg (4 mg Intravenous Given 11/26/20 1857)  morphine 4 MG/ML injection 4 mg (4 mg Intravenous Given 11/26/20 1857)  iohexol (OMNIPAQUE) 350 MG/ML injection 85 mL (85 mLs Intravenous Contrast Given 11/26/20 1948)  lidocaine (XYLOCAINE) 2 % jelly 1 application (1 application Topical Given 11/26/20 2224)  Benzocaine (HURRCAINE) 20 % mouth spray ( Mouth/Throat Given 11/26/20 2224)  ondansetron (ZOFRAN) injection 4 mg (4 mg Intravenous Given 11/27/20 0002)  morphine 4 MG/ML injection 4 mg (4 mg Intravenous Given 11/27/20 0004)  diatrizoate meglumine-sodium (GASTROGRAFIN) 66-10 % solution 90 mL (90 mLs Per NG tube Given 11/27/20 1209)    ED Course  I have reviewed the triage vital signs and the nursing notes.  Pertinent labs & imaging results that were available during my care of the patient were reviewed by me and considered in my medical decision making (see chart for details).    MDM Rules/Calculators/A&P                           Patient presents for acute onset of abdominal pain this morning.  Pain has been persistent but waxing and waning in severity.  On arrival in the ED, pain is maximal in the right upper quadrant.  She reportedly had an episode of pallor and diaphoresis prior to being bedded in the ED.  She denies any cardiac history.  On initial assessment, patient is well-appearing.  She does have tenderness in the area of epigastrium and right upper quadrant.  Bolus of IV fluids was ordered.   Morphine and Zofran ordered for symptomatic relief.  Laboratory work-up was initiated  to include cardiac enzymes.  EKG showed no evidence of ischemia.  Hepatobiliary enzymes were unremarkable.  CBC showed normal hemoglobin with no leukocytosis.  CT scan of abdomen pelvis showed findings of small bowel obstruction.  NG tube was placed.  Patient was admitted for further management.  Final Clinical Impression(s) / ED Diagnoses Final diagnoses:  Small bowel obstruction [facility])    Rx / DC Orders ED Discharge Orders     None        Godfrey Pick, MD 11/27/20 1317

## 2020-11-26 NOTE — ED Notes (Signed)
EKG given to Dr. Doren Custard

## 2020-11-26 NOTE — ED Notes (Signed)
Patient transported to CT 

## 2020-11-26 NOTE — ED Triage Notes (Signed)
C/o diffuse abd pain with n/v/d x 1 day HX SBO

## 2020-11-26 NOTE — ED Notes (Signed)
ED Provider at bedside. 

## 2020-11-27 ENCOUNTER — Inpatient Hospital Stay (HOSPITAL_COMMUNITY): Payer: BC Managed Care – PPO

## 2020-11-27 ENCOUNTER — Encounter (HOSPITAL_COMMUNITY): Payer: Self-pay | Admitting: Internal Medicine

## 2020-11-27 DIAGNOSIS — K56609 Unspecified intestinal obstruction, unspecified as to partial versus complete obstruction: Principal | ICD-10-CM

## 2020-11-27 DIAGNOSIS — Z79899 Other long term (current) drug therapy: Secondary | ICD-10-CM | POA: Diagnosis not present

## 2020-11-27 DIAGNOSIS — N200 Calculus of kidney: Secondary | ICD-10-CM | POA: Diagnosis present

## 2020-11-27 DIAGNOSIS — Z20822 Contact with and (suspected) exposure to covid-19: Secondary | ICD-10-CM | POA: Diagnosis present

## 2020-11-27 DIAGNOSIS — I10 Essential (primary) hypertension: Secondary | ICD-10-CM | POA: Diagnosis present

## 2020-11-27 DIAGNOSIS — R8271 Bacteriuria: Secondary | ICD-10-CM | POA: Diagnosis present

## 2020-11-27 DIAGNOSIS — Z981 Arthrodesis status: Secondary | ICD-10-CM | POA: Diagnosis not present

## 2020-11-27 DIAGNOSIS — Z8249 Family history of ischemic heart disease and other diseases of the circulatory system: Secondary | ICD-10-CM | POA: Diagnosis not present

## 2020-11-27 DIAGNOSIS — K219 Gastro-esophageal reflux disease without esophagitis: Secondary | ICD-10-CM | POA: Diagnosis present

## 2020-11-27 LAB — BASIC METABOLIC PANEL
Anion gap: 6 (ref 5–15)
BUN: 17 mg/dL (ref 8–23)
CO2: 27 mmol/L (ref 22–32)
Calcium: 9.1 mg/dL (ref 8.9–10.3)
Chloride: 102 mmol/L (ref 98–111)
Creatinine, Ser: 0.66 mg/dL (ref 0.44–1.00)
GFR, Estimated: >60 mL/min (ref >60)
Glucose, Bld: 106 mg/dL — ABNORMAL HIGH (ref 70–99)
Potassium: 3.7 mmol/L (ref 3.5–5.1)
Sodium: 135 mmol/L (ref 135–145)

## 2020-11-27 LAB — CBC
HCT: 36.3 % (ref 36.0–46.0)
Hemoglobin: 12.1 g/dL (ref 12.0–15.0)
MCH: 30 pg (ref 26.0–34.0)
MCHC: 33.3 g/dL (ref 30.0–36.0)
MCV: 89.9 fL (ref 80.0–100.0)
Platelets: 199 10*3/uL (ref 150–400)
RBC: 4.04 MIL/uL (ref 3.87–5.11)
RDW: 14.1 % (ref 11.5–15.5)
WBC: 6.9 10*3/uL (ref 4.0–10.5)
nRBC: 0 % (ref 0.0–0.2)

## 2020-11-27 LAB — SURGICAL PCR SCREEN
MRSA, PCR: NEGATIVE
Staphylococcus aureus: NEGATIVE

## 2020-11-27 LAB — MAGNESIUM: Magnesium: 2 mg/dL (ref 1.7–2.4)

## 2020-11-27 LAB — HIV ANTIBODY (ROUTINE TESTING W REFLEX): HIV Screen 4th Generation wRfx: NONREACTIVE

## 2020-11-27 MED ORDER — ONDANSETRON HCL 4 MG PO TABS: 4.0000 mg | ORAL_TABLET | Freq: Four times a day (QID) | ORAL | Status: DC | PRN

## 2020-11-27 MED ORDER — OXYCODONE HCL 5 MG PO TABS: 5.0000 mg | ORAL_TABLET | ORAL | Status: DC | PRN

## 2020-11-27 MED ORDER — SENNOSIDES-DOCUSATE SODIUM 8.6-50 MG PO TABS: 1.0000 | ORAL_TABLET | Freq: Every evening | ORAL | Status: DC | PRN

## 2020-11-27 MED ORDER — MUPIROCIN 2 % EX OINT: 1.0000 "application " | TOPICAL_OINTMENT | Freq: Two times a day (BID) | CUTANEOUS | Status: DC

## 2020-11-27 MED ORDER — ACETAMINOPHEN 650 MG RE SUPP: 650.0000 mg | Freq: Four times a day (QID) | RECTAL | Status: DC | PRN

## 2020-11-27 MED ORDER — DEXTROSE-NACL 5-0.45 % IV SOLN: INTRAVENOUS | Status: DC

## 2020-11-27 MED ORDER — ACETAMINOPHEN 160 MG/5ML PO SOLN: 650.0000 mg | Freq: Four times a day (QID) | ORAL | Status: DC | PRN

## 2020-11-27 MED ORDER — POTASSIUM CHLORIDE IN NACL 20-0.9 MEQ/L-% IV SOLN
INTRAVENOUS | Status: DC
  Filled 2020-11-27: qty 1000

## 2020-11-27 MED ORDER — ONDANSETRON HCL 4 MG/2ML IJ SOLN: 4.0000 mg | Freq: Four times a day (QID) | INTRAMUSCULAR | Status: DC | PRN

## 2020-11-27 MED ORDER — TAMSULOSIN HCL 0.4 MG PO CAPS: 0.4000 mg | ORAL_CAPSULE | Freq: Every day | ORAL | Status: DC

## 2020-11-27 MED ORDER — ENOXAPARIN SODIUM 40 MG/0.4ML IJ SOSY
40.0000 mg | PREFILLED_SYRINGE | INTRAMUSCULAR | Status: DC
  Administered 2020-11-27 – 2020-11-28 (×2): 40 mg via SUBCUTANEOUS
  Filled 2020-11-27: qty 0.4

## 2020-11-27 MED ORDER — HYDRALAZINE HCL 20 MG/ML IJ SOLN: 10.0000 mg | INTRAMUSCULAR | Status: DC | PRN

## 2020-11-27 MED ORDER — FAMOTIDINE IN NACL 20-0.9 MG/50ML-% IV SOLN: 20.0000 mg | Freq: Two times a day (BID) | INTRAVENOUS | Status: DC

## 2020-11-27 MED ORDER — PANTOPRAZOLE SODIUM 40 MG IV SOLR
40.0000 mg | INTRAVENOUS | Status: DC
  Administered 2020-11-27 – 2020-11-28 (×2): 40 mg via INTRAVENOUS
  Filled 2020-11-27 (×2): qty 40

## 2020-11-27 MED ORDER — MORPHINE SULFATE (PF) 2 MG/ML IV SOLN
2.0000 mg | INTRAVENOUS | Status: DC | PRN
  Administered 2020-11-27 – 2020-11-28 (×4): 2 mg via INTRAVENOUS
  Filled 2020-11-27 (×4): qty 1

## 2020-11-27 MED ORDER — DIATRIZOATE MEGLUMINE & SODIUM 66-10 % PO SOLN
90.0000 mL | Freq: Once | ORAL | Status: AC
  Administered 2020-11-27: 90 mL via NASOGASTRIC
  Filled 2020-11-27: qty 90

## 2020-11-27 MED ORDER — METOPROLOL TARTRATE 5 MG/5ML IV SOLN: 5.0000 mg | INTRAVENOUS | Status: DC | PRN

## 2020-11-27 NOTE — Consult Note (Signed)
Angela Thornton 03-28-1955  PT:[id number].    Requesting MD: Dr. Gerlean Ren Chief Complaint/Reason for Consult: SBO  HPI:  This is a very pleasant 65 year old female with a past medical history of hypertension, arthritis, reflux, with 2 prior episodes of a small bowel obstruction last year that resolved with conservative management.  She has had 2 prior cesarean sections as well as a hiatal hernia repair.  She states that yesterday morning she began having crampy abdominal pain.  She then had a bowel movement.  Her crampy abdominal pain returned and she had another bowel movement later in the day.  However she then developed nausea and vomiting.  Nothing seemed to help her symptoms and she began to feel faint like she was going to have a syncopal episode.  She went to urgent care who then referred her to [place] for further evaluation.  After work-up, she was noted to have a small bowel obstruction on the CT scan.  She was referred to [facility] for admission.  We have been asked to evaluate her here for further recommendations.  Since arrival, her pain has resolved and she has passed some flatus.  ROS: ROS: Please see HPI, otherwise all other systems have been reviewed and are negative except for significant bursitis and arthritis pain throughout her right hip and right knee.  Family History  Problem Relation Age of Onset   Hypertension Mother    Alzheimer's disease Father    Hypertension Father    Heart disease Father        cabg x4   Arthritis Father    Hypertension Maternal Grandmother    Arthritis Maternal Grandmother    Hypertension Maternal Grandfather    Arthritis Maternal Grandfather    Hypertension Paternal Grandmother    Hypertension Paternal Grandfather     Past Medical History:  Diagnosis Date   Allergy    seasonal   Anemia    GERD (gastroesophageal reflux disease)    Hypertension    SBO (small bowel obstruction) ([place])     Past Surgical  History:  Procedure Laterality Date   CERVICAL FUSION  2007   C4-5-6 and fractured neck   [place]    Social History:  reports that she has never smoked. She has never used smokeless tobacco. She reports current alcohol use of about 1.0 standard drink per week. She reports that she does not use drugs.  Allergies:  Allergies  Allergen Reactions   Ciprofloxacin     REACTION: tingling in hands, dizzy, tingling in feet   Sulfa Antibiotics     Other reaction(s): Other (See Comments), Other (See Comments) Other reaction(s):  numbness numbness     Medications Prior to Admission  Medication Sig Dispense Refill   lisinopril-hydrochlorothiazide (PRINZIDE,ZESTORETIC) 20-25 MG per tablet TAKE 1 TABLET BY MOUTH EVERY DAY ( DOCTOR WOULD LIKE TO SEE YOU FOR OFFICE VISIT) 15 tablet 0   Multiple Vitamins-Minerals (MULTIVITAMIN WITH MINERALS) tablet Take 1 tablet by mouth daily.       PARoxetine (PAXIL) 20 MG tablet Take 20 mg by mouth daily.     esomeprazole (NEXIUM) 40 MG capsule Take 1 capsule (40 mg total) by mouth daily before breakfast. (Patient not taking: Reported on 11/27/2020) 30 capsule 8   Ferrous Sulfate (SLOW RELEASE IRON PO) Take 1 tablet by mouth daily.   (Patient not taking: Reported  on 11/27/2020)     fluticasone (FLONASE) 50 MCG/ACT nasal spray INHALE 2 SPRAYS IN EACH NOSTRIL EVERY DAY (Patient not taking: Reported on 11/27/2020) 16 g 0     Physical Exam: Blood pressure (!) 153/78, pulse 71, temperature 97.9 F (36.6 C), temperature source Oral, resp. rate 16, height 5' (1.524 m), weight 62 kg, SpO2 98 %. General: pleasant, WD, WN black female who is laying in bed in NAD HEENT: head is normocephalic, atraumatic.  Sclera are noninjected.  PERRL.  Ears and nose without any masses or lesions.  Mouth is pink and moist Heart: regular, rate, and rhythm.  Normal s1,s2. No obvious murmurs, gallops, or rubs noted.  Palpable  radial and pedal pulses bilaterally Lungs: CTAB, no wheezes, rhonchi, or rales noted.  Respiratory effort nonlabored Abd: soft, NT, ND, +BS, no masses, hernias, or organomegaly.  She does have a midline laparotomy scar. MS: all 4 extremities are symmetrical with no cyanosis, clubbing, or edema, except minimal edema of the right knee, which is chronic. Skin: warm and dry with no masses, lesions, or rashes Neuro: Cranial nerves 2-12 grossly intact, sensation is normal throughout Psych: A&Ox3 with an appropriate affect.   Results for orders placed or performed during the hospital encounter of 11/26/20 (from the past 48 hour(s))  Urinalysis, Routine w reflex microscopic Urine, Clean Catch     Status: Abnormal   Collection Time: 11/26/20  4:54 PM  Result Value Ref Range   Color, Urine AMBER (A) YELLOW    Comment: BIOCHEMICALS MAY BE AFFECTED BY COLOR   APPearance CLEAR CLEAR   Specific Gravity, Urine 1.030 1.005 - 1.030   pH 6.0 5.0 - 8.0   Glucose, UA NEGATIVE NEGATIVE mg/dL   Hgb urine dipstick MODERATE (A) NEGATIVE   Bilirubin Urine NEGATIVE NEGATIVE   Ketones, ur NEGATIVE NEGATIVE mg/dL   Protein, ur NEGATIVE NEGATIVE mg/dL   Nitrite NEGATIVE NEGATIVE   Leukocytes,Ua NEGATIVE NEGATIVE    Comment: Performed at [facility], [address] [place], [place] [address]  Urinalysis, Microscopic (reflex)     Status: Abnormal   Collection Time: 11/26/20  4:54 PM  Result Value Ref Range   RBC / HPF 11-20 0 - 5 RBC/hpf   WBC, UA 0-5 0 - 5 WBC/hpf   Bacteria, UA FEW (A) NONE SEEN   Squamous Epithelial / LPF 0-5 0 - 5   Mucus PRESENT     Comment: Performed at [facility], [address] [place], [place] [address]  CBG monitoring, ED     Status: None   Collection Time: 11/26/20  6:12 PM  Result Value Ref Range   Glucose-Capillary 87 70 - 99 mg/dL    Comment: Glucose reference range applies only to samples taken after fasting for at least 8 hours.  Comprehensive  metabolic panel     Status: Abnormal   Collection Time: 11/26/20  6:42 PM  Result Value Ref Range   Sodium 137 135 - 145 mmol/L   Potassium 4.1 3.5 - 5.1 mmol/L   Chloride 99 98 - 111 mmol/L   CO2 29 22 - 32 mmol/L   Glucose, Bld 119 (H) 70 - 99 mg/dL    Comment: Glucose reference range applies only to samples taken after fasting for at least 8 hours.   BUN 21 8 - 23 mg/dL   Creatinine, Ser 0.69 0.44 - 1.00 mg/dL   Calcium 10.0 8.9 - 10.3 mg/dL   Total Protein 8.1 6.5 -  8.1 g/dL   Albumin 4.3 3.5 - 5.0 g/dL   AST 17 15 - 41 U/L   ALT 12 0 - 44 U/L   Alkaline Phosphatase 63 38 - 126 U/L   Total Bilirubin 0.7 0.3 - 1.2 mg/dL   GFR, Estimated >60 >60 mL/min    Comment: (NOTE) Calculated using the CKD-EPI Creatinine Equation (2021)    Anion gap 9 5 - 15    Comment: Performed at [facility], [address] [place], [place] [address]  Lipase, blood     Status: Abnormal   Collection Time: 11/26/20  6:42 PM  Result Value Ref Range   Lipase 65 (H) 11 - 51 U/L    Comment: Performed at [facility], [address] [place], [place] [address]  CBC with Diff     Status: None   Collection Time: 11/26/20  6:42 PM  Result Value Ref Range   WBC 8.8 4.0 - 10.5 K/uL   RBC 4.80 3.87 - 5.11 MIL/uL   Hemoglobin 14.5 12.0 - 15.0 g/dL   HCT 43.5 36.0 - 46.0 %   MCV 90.6 80.0 - 100.0 fL   MCH 30.2 26.0 - 34.0 pg   MCHC 33.3 30.0 - 36.0 g/dL   RDW 14.4 11.5 - 15.5 %   Platelets 247 150 - 400 K/uL   nRBC 0.0 0.0 - 0.2 %   Neutrophils Relative % 77 %   Neutro Abs 6.8 1.7 - 7.7 K/uL   Lymphocytes Relative 15 %   Lymphs Abs 1.3 0.7 - 4.0 K/uL   Monocytes Relative 7 %   Monocytes Absolute 0.6 0.1 - 1.0 K/uL   Eosinophils Relative 0 %   Eosinophils Absolute 0.0 0.0 - 0.5 K/uL   Basophils Relative 0 %   Basophils Absolute 0.0 0.0 - 0.1 K/uL   Immature Granulocytes 1 %   Abs Immature Granulocytes 0.05 0.00 - 0.07 K/uL    Comment: Performed at [facility], [address] [place], [place] [address]  Protime-INR     Status: None   Collection Time: 11/26/20  6:42 PM  Result Value Ref Range   Prothrombin Time 12.3 11.4 - 15.2 seconds   INR 0.9 0.8 - 1.2    Comment: (NOTE) INR goal varies based on device and disease states. Performed at [facility], [address] [place], [place] [address]   Troponin I (High Sensitivity)     Status: None   Collection Time: 11/26/20  6:42 PM  Result Value Ref Range   Troponin I (High Sensitivity) 3 <18 ng/L    Comment: (NOTE) Elevated high sensitivity troponin I (hsTnI) values and significant  changes across serial measurements may suggest ACS but many other  chronic and acute conditions are known to elevate hsTnI results.  Refer to the "Links" section for chest pain algorithms and additional  guidance. Performed at [facility], [address] [place], [place] [address]   Resp Panel by RT-PCR (Flu A&B, Covid) Nasopharyngeal Swab     Status: None   Collection Time: 11/26/20 10:00 PM   Specimen: Nasopharyngeal Swab; Nasopharyngeal(NP) swabs in vial transport medium  Result Value Ref Range   SARS Coronavirus 2 by RT PCR NEGATIVE NEGATIVE    Comment: (NOTE) SARS-CoV-2 target nucleic acids are NOT DETECTED.  The SARS-CoV-2 RNA is generally detectable in upper respiratory specimens during the acute phase of infection. The lowest concentration of SARS-CoV-2  viral copies this assay can detect is 138 copies/mL. A negative result does not preclude SARS-Cov-2 infection and should not be used as the sole basis for treatment or other patient management decisions. A negative result may occur with  improper specimen collection/handling, submission of specimen other than nasopharyngeal swab, presence of viral mutation(s) within the areas targeted by this assay, and inadequate number of viral copies(<138 copies/mL). A negative result must be combined with clinical observations, patient  history, and epidemiological information. The expected result is Negative.  Fact Sheet for Patients:  EntrepreneurPulse.com.au  Fact Sheet for Healthcare Providers:  IncredibleEmployment.be  This test is no t yet approved or cleared by the [place] FDA and  has been authorized for detection and/or diagnosis of SARS-CoV-2 by FDA under an Emergency Use Authorization (EUA). This EUA will remain  in effect (meaning this test can be used) for the duration of the COVID-19 declaration under Section 564(b)(1) of the Act, 21 U.S.C.section 360bbb-3(b)(1), unless the authorization is terminated  or revoked sooner.       Influenza A by PCR NEGATIVE NEGATIVE   Influenza B by PCR NEGATIVE NEGATIVE    Comment: (NOTE) The Xpert Xpress SARS-CoV-2/FLU/RSV plus assay is intended as an aid in the diagnosis of influenza from Nasopharyngeal swab specimens and should not be used as a sole basis for treatment. Nasal washings and aspirates are unacceptable for Xpert Xpress SARS-CoV-2/FLU/RSV testing.  Fact Sheet for Patients: EntrepreneurPulse.com.au  Fact Sheet for Healthcare Providers: IncredibleEmployment.be  This test is not yet approved or cleared by the [place] FDA and has been authorized for detection and/or diagnosis of SARS-CoV-2 by FDA under an Emergency Use Authorization (EUA). This EUA will remain in effect (meaning this test can be used) for the duration of the COVID-19 declaration under Section 564(b)(1) of the Act, 21 U.S.C. section 360bbb-3(b)(1), unless the authorization is terminated or revoked.  Performed at [facility], [address], [place], [place] [address]   Surgical PCR screen     Status: None   Collection Time: 11/27/20  2:14 AM   Specimen: Nasal Mucosa; Nasal Swab  Result Value Ref Range   MRSA, PCR NEGATIVE NEGATIVE   Staphylococcus aureus NEGATIVE NEGATIVE    Comment:  (NOTE) The Xpert SA Assay (FDA approved for NASAL specimens in patients 33 years of age and older), is one component of a comprehensive surveillance program. It is not intended to diagnose infection nor to guide or monitor treatment. Performed at [facility], [place] [address], [place], [place] [address]   HIV Antibody (routine testing w rflx)     Status: None   Collection Time: 11/27/20  4:23 AM  Result Value Ref Range   HIV Screen 4th Generation wRfx Non Reactive Non Reactive    Comment: Performed at [facility], [place] [address], [place], [place] [id number]  Basic metabolic panel     Status: Abnormal   Collection Time: 11/27/20  4:23 AM  Result Value Ref Range   Sodium 135 135 - 145 mmol/L   Potassium 3.7 3.5 - 5.1 mmol/L   Chloride 102 98 - 111 mmol/L   CO2 27 22 - 32 mmol/L   Glucose, Bld 106 (H) 70 - 99 mg/dL    Comment: Glucose reference range applies only to samples taken after fasting for at least 8 hours.   BUN 17 8 - 23 mg/dL   Creatinine, Ser 0.66 0.44 - 1.00 mg/dL   Calcium 9.1 8.9 -  10.3 mg/dL   GFR, Estimated >60 >60 mL/min    Comment: (NOTE) Calculated using the CKD-EPI Creatinine Equation (2021)    Anion gap 6 5 - 15    Comment: Performed at [facility], [place] [address], [place], [place] [address]  CBC     Status: None   Collection Time: 11/27/20  4:23 AM  Result Value Ref Range   WBC 6.9 4.0 - 10.5 K/uL   RBC 4.04 3.87 - 5.11 MIL/uL   Hemoglobin 12.1 12.0 - 15.0 g/dL   HCT 36.3 36.0 - 46.0 %   MCV 89.9 80.0 - 100.0 fL   MCH 30.0 26.0 - 34.0 pg   MCHC 33.3 30.0 - 36.0 g/dL   RDW 14.1 11.5 - 15.5 %   Platelets 199 150 - 400 K/uL   nRBC 0.0 0.0 - 0.2 %    Comment: Performed at [facility], [place] [address] [place], [place] [address]  Magnesium     Status: None   Collection Time: 11/27/20  4:23 AM  Result Value Ref Range   Magnesium 2.0 1.7 - 2.4 mg/dL    Comment: Performed at [facility], [place] [address], [place],  [address]   DG Abdomen 1 View  Result Date: 11/26/2020 CLINICAL DATA:  Nasogastric tube placement. EXAM: ABDOMEN - 1 VIEW COMPARISON:  CT abdomen and pelvis 11/26/2020. FINDINGS: Nasogastric tube tip is in the body of the stomach with side hole just below the level of the gastroesophageal junction. Dilated small bowel in the upper abdomen appears unchanged. Visualized lung bases are clear. Osseous structures are within normal limits. IMPRESSION: 1. Nasogastric tube tip in the body of the stomach. 2. Dilated small bowel compatible small bowel obstruction. Electronically Signed   By: Ronney Asters M.D.   On: 11/26/2020 22:52   CT ABDOMEN PELVIS W CONTRAST  Result Date: 11/26/2020 CLINICAL DATA:  Nausea and vomiting, evaluate for bowel obstruction. EXAM: CT ABDOMEN AND PELVIS WITH CONTRAST TECHNIQUE: Multidetector CT imaging of the abdomen and pelvis was performed using the standard protocol following bolus administration of intravenous contrast. CONTRAST:  18m OMNIPAQUE IOHEXOL 350 MG/ML SOLN COMPARISON:  CT abdomen and pelvis 09/24/2009. FINDINGS: Lower chest: No acute abnormality. Hepatobiliary: Small gallstones are present. There is no biliary ductal dilatation. The liver appears within normal limits. Pancreas: Unremarkable. No pancreatic ductal dilatation or surrounding inflammatory changes. Spleen: Normal in size without focal abnormality. Adrenals/Urinary Tract: There are likely punctate nonobstructing bilateral renal calculi. Kidneys and bladder are otherwise within normal limits. Stomach/Bowel: There are dilated fluid-filled mid and proximal small bowel loops containing air-fluid levels. Stomach is also dilated with air-fluid level. Transition point is seen along the anterior lower abdominal wall image 2/55. Small bowel loops are completely decompressed distal to this point. Colon is nondilated. Appendix is within normal limits. There is no  pneumatosis or free air. There is mild mesenteric edema. Vascular/Lymphatic: Aortic atherosclerosis. No enlarged abdominal or pelvic lymph nodes. Reproductive: Calcified uterine fibroids are present which have increased in number when compared to the prior examination. Largest fibroid measures 4.3 cm. Adnexa are unremarkable. Other: There is no ascites. There is no focal abdominal wall hernia. Within the retroperitoneum at and below the level of the left kidney there is a lobulated fluid attenuation area without enhancement. This was seen on the prior examination but has increased in size when compared to the prior study. Overall measurements are approximately 2.0 x 3.8 by 5.2 cm. Musculoskeletal: No acute  or significant osseous findings. IMPRESSION: 1. Findings compatible with this he small bowel obstruction. Transition point is seen along the anterior abdominal wall which may be secondary to adhesions. There is mild mesenteric edema. No free air. 2. Nonobstructing bilateral renal calculi. 3. Fibroid uterus. 4. Cystic mass in the left retroperitoneum has increased in size compared to 2011. This is indeterminate and may represent lymphatic malformation/lymphocele. Electronically Signed   By: Ronney Asters M.D.   On: 11/26/2020 20:31      Assessment/Plan Small bowel obstruction The patient's imaging and labs have been reviewed.  She has been evaluated.  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  She has an NG tube already in place with no output.  She has also already passed some flatus since arrival.  Her pain has resolved.  We will however, proceed with the small bowel obstruction protocol to assure she is resolving.  She agrees with this plan.  We did discuss that if she were to fail conservative management she may require surgical intervention; however, it appears at this point in time that will hopefully not be the case.  We will continue  to follow with you.   FEN - NPO/NGT/IVFs VTE - Lovenox  ID - none  HTN GERD Arthritis   Henreitta Cea, [facility] 11/27/2020, 10:46 AM Please see Amion for pager number during day hours 7:00am-4:30pm or 7:00am -11:30am on weekends

## 2020-11-27 NOTE — Progress Notes (Signed)
PROGRESS NOTE    Angela Thornton  [id number] DOB: December 01, 1955 DOA: 11/26/2020 PCP: Vira Blanco, MD   Brief Narrative:  65 year old with history of seasonal allergy, unspecified anemia, previous hiatal hernia repair, C-section, GERD, HTN, history of small bowel obstruction comes to the hospital with complaints of diffuse abdominal pain, nausea vomiting and diarrhea.  CT of the abdomen pelvis showed small bowel obstruction with transition point in the anterior abdomen, nonobstructive bilateral renal stones, fibroid uterus, cystic mass to the left retroperitoneum increase in size compared to 2011   Assessment & Plan:   Principal Problem:   SBO (small bowel obstruction) (HCC) Active Problems:   Essential hypertension   GERD (gastroesophageal reflux disease)  Small bowel obstruction - Currently patient is n.p.o. with NG tube in place.  Pain control, antiemetics.  General surgery consulted.  Small bowel follow-through.  Bilateral nonobstructive renal stone - IV fluids.  Flomax when able to tolerate orals  Asymptomatic bacteriuria - Hold off on antibiotics.  Closely monitor.  Essential hypertension - IV hydralazine as needed.  Home lisinopril/HCTZ on hold  GERD - PPI daily     DVT prophylaxis: enoxaparin (LOVENOX) injection 40 mg Start: 11/27/20 1000 Code Status: Full code Family Communication:    Status is: Inpatient  Remains inpatient appropriate because:IV treatments appropriate due to intensity of illness or inability to take PO  Dispo: The patient is from: Home              Anticipated d/c is to: Home              Patient currently is not medically stable to d/c.   Difficult to place patient No        Subjective: States her abdomen feels slightly soft this morning, she was able to pass gas.  Denies any nausea or vomiting.  This is her third episode in last 15 months.  Previously was managed conservatively.  Review of Systems Otherwise  negative except as per HPI, including: General: Denies fever, chills, night sweats or unintended weight loss. Resp: Denies cough, wheezing, shortness of breath. Cardiac: Denies chest pain, palpitations, orthopnea, paroxysmal nocturnal dyspnea. GI: Denies abdominal pain, nausea, vomiting, diarrhea or constipation GU: Denies dysuria, frequency, hesitancy or incontinence MS: Denies muscle aches, joint pain or swelling Neuro: Denies headache, neurologic deficits (focal weakness, numbness, tingling), abnormal gait Psych: Denies anxiety, depression, SI/HI/AVH Skin: Denies new rashes or lesions ID: Denies sick contacts, exotic exposures, travel  Examination:  Constitutional: Not in acute distress.  NG tube in place but not attached to suction at this time Respiratory: Clear to auscultation bilaterally Cardiovascular: Normal sinus rhythm, no rubs Abdomen: Nontender nondistended good bowel sounds Musculoskeletal: No edema noted Skin: No rashes seen Neurologic: CN 2-12 grossly intact.  And nonfocal Psychiatric: Normal judgment and insight. Alert and oriented x 3. Normal mood. Objective: Vitals:   11/27/20 0000 11/27/20 0030 11/27/20 0149 11/27/20 0516  BP: (!) 143/80 137/76 (!) 158/74 (!) 156/71  Pulse: 80 83 62 80  Resp: '14 13 16 16  '$ Temp:   98.5 F (36.9 C) 98.7 F (37.1 C)  TempSrc:   Oral Oral  SpO2: 98% 95% 100% 100%  Weight:   62 kg   Height:   5' (1.524 m)     Intake/Output Summary (Last 24 hours) at 11/27/2020 0735 Last data filed at 11/27/2020 0600 Gross per 24 hour  Intake 1305.05 ml  Output 400 ml  Net 905.05 ml   Filed Weights   11/26/20 1646  11/27/20 0149  Weight: 63 kg 62 kg     Data Reviewed:   CBC: Recent Labs  Lab 11/26/20 1842 11/27/20 0423  WBC 8.8 6.9  NEUTROABS 6.8  --   HGB 14.5 12.1  HCT 43.5 36.3  MCV 90.6 89.9  PLT 247 [id number]   Basic Metabolic Panel: Recent Labs  Lab 11/26/20 1842 11/27/20 0423  NA 137 135  K 4.1 3.7  CL 99 102  CO2 29  27  GLUCOSE 119* 106*  BUN 21 17  CREATININE 0.69 0.66  CALCIUM 10.0 9.1  MG  --  2.0   GFR: Estimated Creatinine Clearance: 60 mL/min (by C-G formula based on SCr of 0.66 mg/dL). Liver Function Tests: Recent Labs  Lab 11/26/20 1842  AST 17  ALT 12  ALKPHOS 63  BILITOT 0.7  PROT 8.1  ALBUMIN 4.3   Recent Labs  Lab 11/26/20 1842  LIPASE 65*   No results for input(s): AMMONIA in the last 168 hours. Coagulation Profile: Recent Labs  Lab 11/26/20 1842  INR 0.9   Cardiac Enzymes: No results for input(s): CKTOTAL, CKMB, CKMBINDEX, TROPONINI in the last 168 hours. BNP (last 3 results) No results for input(s): PROBNP in the last 8760 hours. HbA1C: No results for input(s): HGBA1C in the last 72 hours. CBG: Recent Labs  Lab 11/26/20 1812  GLUCAP 87   Lipid Profile: No results for input(s): CHOL, HDL, LDLCALC, TRIG, CHOLHDL, LDLDIRECT in the last 72 hours. Thyroid Function Tests: No results for input(s): TSH, T4TOTAL, FREET4, T3FREE, THYROIDAB in the last 72 hours. Anemia Panel: No results for input(s): VITAMINB12, FOLATE, FERRITIN, TIBC, IRON, RETICCTPCT in the last 72 hours. Sepsis Labs: No results for input(s): PROCALCITON, LATICACIDVEN in the last 168 hours.  Recent Results (from the past 240 hour(s))  Resp Panel by RT-PCR (Flu A&B, Covid) Nasopharyngeal Swab     Status: None   Collection Time: 11/26/20 10:00 PM   Specimen: Nasopharyngeal Swab; Nasopharyngeal(NP) swabs in vial transport medium  Result Value Ref Range Status   SARS Coronavirus 2 by RT PCR NEGATIVE NEGATIVE Final    Comment: (NOTE) SARS-CoV-2 target nucleic acids are NOT DETECTED.  The SARS-CoV-2 RNA is generally detectable in upper respiratory specimens during the acute phase of infection. The lowest concentration of SARS-CoV-2 viral copies this assay can detect is 138 copies/mL. A negative result does not preclude SARS-Cov-2 infection and should not be used as the sole basis for treatment  or other patient management decisions. A negative result may occur with  improper specimen collection/handling, submission of specimen other than nasopharyngeal swab, presence of viral mutation(s) within the areas targeted by this assay, and inadequate number of viral copies(<138 copies/mL). A negative result must be combined with clinical observations, patient history, and epidemiological information. The expected result is Negative.  Fact Sheet for Patients:  EntrepreneurPulse.com.au  Fact Sheet for Healthcare Providers:  IncredibleEmployment.be  This test is no t yet approved or cleared by the [place] FDA and  has been authorized for detection and/or diagnosis of SARS-CoV-2 by FDA under an Emergency Use Authorization (EUA). This EUA will remain  in effect (meaning this test can be used) for the duration of the COVID-19 declaration under Section 564(b)(1) of the Act, 21 U.S.C.section 360bbb-3(b)(1), unless the authorization is terminated  or revoked sooner.       Influenza A by PCR NEGATIVE NEGATIVE Final   Influenza B by PCR NEGATIVE NEGATIVE Final    Comment: (NOTE) The Xpert Xpress SARS-CoV-2/FLU/RSV plus assay  is intended as an aid in the diagnosis of influenza from Nasopharyngeal swab specimens and should not be used as a sole basis for treatment. Nasal washings and aspirates are unacceptable for Xpert Xpress SARS-CoV-2/FLU/RSV testing.  Fact Sheet for Patients: EntrepreneurPulse.com.au  Fact Sheet for Healthcare Providers: IncredibleEmployment.be  This test is not yet approved or cleared by the [place] FDA and has been authorized for detection and/or diagnosis of SARS-CoV-2 by FDA under an Emergency Use Authorization (EUA). This EUA will remain in effect (meaning this test can be used) for the duration of the COVID-19 declaration under Section 564(b)(1) of the Act, 21 U.S.C. section  360bbb-3(b)(1), unless the authorization is terminated or revoked.  Performed at [facility], [address], [place], [place] [address]   Surgical PCR screen     Status: None   Collection Time: 11/27/20  2:14 AM   Specimen: Nasal Mucosa; Nasal Swab  Result Value Ref Range Status   MRSA, PCR NEGATIVE NEGATIVE Final   Staphylococcus aureus NEGATIVE NEGATIVE Final    Comment: (NOTE) The Xpert SA Assay (FDA approved for NASAL specimens in patients 71 years of age and older), is one component of a comprehensive surveillance program. It is not intended to diagnose infection nor to guide or monitor treatment. Performed at [facility], [place] [address] [place], [place] [address]          Radiology Studies: DG Abdomen 1 View  Result Date: 11/26/2020 CLINICAL DATA:  Nasogastric tube placement. EXAM: ABDOMEN - 1 VIEW COMPARISON:  CT abdomen and pelvis 11/26/2020. FINDINGS: Nasogastric tube tip is in the body of the stomach with side hole just below the level of the gastroesophageal junction. Dilated small bowel in the upper abdomen appears unchanged. Visualized lung bases are clear. Osseous structures are within normal limits. IMPRESSION: 1. Nasogastric tube tip in the body of the stomach. 2. Dilated small bowel compatible small bowel obstruction. Electronically Signed   By: Ronney Asters M.D.   On: 11/26/2020 22:52   CT ABDOMEN PELVIS W CONTRAST  Result Date: 11/26/2020 CLINICAL DATA:  Nausea and vomiting, evaluate for bowel obstruction. EXAM: CT ABDOMEN AND PELVIS WITH CONTRAST TECHNIQUE: Multidetector CT imaging of the abdomen and pelvis was performed using the standard protocol following bolus administration of intravenous contrast. CONTRAST:  28m OMNIPAQUE IOHEXOL 350 MG/ML SOLN COMPARISON:  CT abdomen and pelvis 09/24/2009. FINDINGS: Lower chest: No acute abnormality. Hepatobiliary: Small gallstones are present. There is no biliary ductal dilatation. The  liver appears within normal limits. Pancreas: Unremarkable. No pancreatic ductal dilatation or surrounding inflammatory changes. Spleen: Normal in size without focal abnormality. Adrenals/Urinary Tract: There are likely punctate nonobstructing bilateral renal calculi. Kidneys and bladder are otherwise within normal limits. Stomach/Bowel: There are dilated fluid-filled mid and proximal small bowel loops containing air-fluid levels. Stomach is also dilated with air-fluid level. Transition point is seen along the anterior lower abdominal wall image 2/55. Small bowel loops are completely decompressed distal to this point. Colon is nondilated. Appendix is within normal limits. There is no pneumatosis or free air. There is mild mesenteric edema. Vascular/Lymphatic: Aortic atherosclerosis. No enlarged abdominal or pelvic lymph nodes. Reproductive: Calcified uterine fibroids are present which have increased in number when compared to the prior examination. Largest fibroid measures 4.3 cm. Adnexa are unremarkable. Other: There is no ascites. There is no focal abdominal wall hernia. Within the retroperitoneum at and below the level of the left kidney there is a lobulated fluid attenuation area without  enhancement. This was seen on the prior examination but has increased in size when compared to the prior study. Overall measurements are approximately 2.0 x 3.8 by 5.2 cm. Musculoskeletal: No acute or significant osseous findings. IMPRESSION: 1. Findings compatible with this he small bowel obstruction. Transition point is seen along the anterior abdominal wall which may be secondary to adhesions. There is mild mesenteric edema. No free air. 2. Nonobstructing bilateral renal calculi. 3. Fibroid uterus. 4. Cystic mass in the left retroperitoneum has increased in size compared to 2011. This is indeterminate and may represent lymphatic malformation/lymphocele. Electronically Signed   By: Ronney Asters M.D.   On: 11/26/2020 20:31         Scheduled Meds:  enoxaparin (LOVENOX) injection  40 mg Subcutaneous Q24H   pantoprazole (PROTONIX) IV  40 mg Intravenous Q24H   Continuous Infusions:  0.9 % NaCl with KCl 20 mEq / L 88 mL/hr at 11/27/20 0232     LOS: 0 days   Time spent= 35 mins    Loeta Herst Arsenio Loader, MD [facility]  If 7PM-7AM, please contact night-coverage  11/27/2020, 7:35 AM

## 2020-11-27 NOTE — Progress Notes (Signed)
Chaplain engaged in an initial visit with Angela Thornton.  Angela Thornton shared about her healthcare journey and about her loving family.  Angela Thornton shared her prayer and hopes for her health.    Chaplain offered listening, support, prayer and presence.    11/27/20 1100  Clinical Encounter Type  Visited With Patient  Visit Type Initial;Spiritual support  Spiritual Encounters  Spiritual Needs Ritual

## 2020-11-27 NOTE — Plan of Care (Signed)
Plan of care discussed. NG tube to low intermittent wall suction. Ambulated to BR with independent use of a walker.  Reports using a cane at home.  Denies any pain.

## 2020-11-27 NOTE — H&P (Signed)
History and Physical    Angela Thornton [id number] DOB: Jun 26, 1955 DOA: 11/26/2020  PCP: Vira Blanco, MD   Patient coming from: Home.  I have personally briefly reviewed patient's old medical records in [place]  Chief Complaint: Abdominal pain.  HPI: Angela Thornton is a 66 y.o. female with medical history significant of seasonal allergy, unspecified anemia, GERD, hypertension, history of small bowel obstruction who is coming to the emergency department with complaints of diffuse abdominal pain associated with nausea, vomiting and diarrhea for 1 day.  She was constipated recently.  No melena or hematochezia.  No flank pain, dysuria, frequency or hematuria.  She denied fever, chills, but felt malaised.  No rhinorrhea, sore throat, wheezing, dyspnea or hemoptysis.  Denied chest pain, palpitations, diaphoresis, PND, orthopnea or pitting edema of the lower extremities.  No polyuria, polydipsia, polyphagia or blurred vision.  ED Course: Initial vital signs were temperature 98 F, pulse 64, respiration 18, BP 137/76 mmHg O2 sat 99% on room air.  The patient received morphine 4 mg IVP, ondansetron 4 mg IVP and a 1000 mL LR bolus.  An NG tube was placed.  Lab work: Her urinalysis showed moderate hemoglobinuria with few bacteria on microscopic examination but was otherwise unremarkable.  CBC was normal.  PT 12.3 INR 0.9.  Lipase was slightly elevated 65 units/L.  CMP showed a glucose of 119 mg/dL, but was otherwise unremarkable.  Troponin was negative.  Imaging: A 1 view chest radiograph showed the NGT tip in the body of the stomach.  There were dilated small bowel compatible with small bowel obstruction.  CT abdomen/pelvis with contrast show small bowel obstruction with transition point along the anterior abdominal wall which may be secondary to lesions.  There is mild mesenteric edema.  No free air.  There were nonobstructive bilateral renal calculi.  Fibroid uterus.  Cystic mass  in the left retroperitoneum has increased in size when compared to 2011.  This is indeterminate and may represent a lymphatic malformation/lymphocele.  Please see images and full regular report for further detail.  Review of Systems: As per HPI otherwise all other systems reviewed and are negative.  Past Medical History:  Diagnosis Date   Allergy    seasonal   Anemia    GERD (gastroesophageal reflux disease)    Hypertension    SBO (small bowel obstruction) ([place])     Past Surgical History:  Procedure Laterality Date   CERVICAL FUSION  2007   C4-5-6 and fractured neck   [facility]ARE[facility]    Social History  reports that she has never smoked. She has never used smokeless tobacco. She reports current alcohol use of about 1.0 standard drink per week. She reports that she does not use drugs.  Allergies  Allergen Reactions   Ciprofloxacin     REACTION: tingling in hands, dizzy, tingling in feet   Sulfa Antibiotics     Other reaction(s): Other (See Comments), Other (See Comments) Other reaction(s):  numbness numbness     Family History  Problem Relation Age of Onset   Hypertension Mother    Alzheimer's disease Father    Hypertension Father    Heart disease Father        cabg x4   Arthritis Father    Hypertension Maternal Grandmother    Arthritis Maternal Grandmother    Hypertension Maternal Grandfather    Arthritis Maternal Grandfather  Hypertension Paternal Grandmother    Hypertension Paternal Grandfather    Prior to Admission medications   Medication Sig Start Date End Date Taking? Authorizing Provider  lisinopril-hydrochlorothiazide (PRINZIDE,ZESTORETIC) 20-25 MG per tablet TAKE 1 TABLET BY MOUTH EVERY DAY ( DOCTOR WOULD LIKE TO SEE YOU FOR OFFICE VISIT) 08/15/12  Yes Carollee Herter, Alferd Apa, DO  Multiple Vitamins-Minerals (MULTIVITAMIN WITH MINERALS) tablet Take 1 tablet by mouth daily.     Yes [provider]  esomeprazole (NEXIUM) 40 MG capsule Take 1 capsule (40 mg total) by mouth daily before breakfast. Patient not taking: Reported on 11/27/2020 09/02/11   Inda Castle, MD  Ferrous Sulfate (SLOW RELEASE IRON PO) Take 1 tablet by mouth daily.   Patient not taking: Reported on 11/27/2020    [provider]  fluticasone (FLONASE) 50 MCG/ACT nasal spray INHALE 2 SPRAYS IN EACH NOSTRIL EVERY DAY Patient not taking: Reported on 11/27/2020 05/17/12   Ann Held, DO  PARoxetine (PAXIL) 20 MG tablet Take 20 mg by mouth daily. 10/07/20   [provider]   Physical Exam: Vitals:   11/26/20 2300 11/27/20 0000 11/27/20 0030 11/27/20 0149  BP: (!) 148/72 (!) 143/80 137/76 (!) 158/74  Pulse: 84 80 83 62  Resp: '14 14 13 16  '$ Temp:    98.5 F (36.9 C)  TempSrc:    Oral  SpO2: 100% 98% 95% 100%  Weight:    62 kg  Height:    5' (1.524 m)   Constitutional: NAD, calm, comfortable Eyes: PERRL, lids and conjunctivae normal ENMT: NGT in place.  Mucous membranes are moist. Posterior pharynx clear of any exudate or lesions. Neck: normal, supple, no masses, no thyromegaly Respiratory: clear to auscultation bilaterally, no wheezing, no crackles. Normal respiratory effort. No accessory muscle use.  Cardiovascular: Regular rate and rhythm, no murmurs / rubs / gallops. No extremity edema. 2+ pedal pulses. No carotid bruits.  Abdomen: Mildly distended.  Bowel sounds hyperactive.  Mild diffuse tenderness without guarding or rebound, no masses palpated. No hepatosplenomegaly. Musculoskeletal: no clubbing / cyanosis. Good ROM, no contractures. Normal muscle tone.  Skin: no acute rashes, lesions, ulcers on very limited examination. Neurologic: CN 2-12 grossly intact. Sensation intact, DTR normal. Strength 5/5 in all 4.  Psychiatric: Normal judgment and insight. Alert and oriented x 3. Normal mood.   Labs on Admission: I have personally reviewed following labs and imaging  studies  CBC: Recent Labs  Lab 11/26/20 1842  WBC 8.8  NEUTROABS 6.8  HGB 14.5  HCT 43.5  MCV 90.6  PLT [id number]    Basic Metabolic Panel: Recent Labs  Lab 11/26/20 1842  NA 137  K 4.1  CL 99  CO2 29  GLUCOSE 119*  BUN 21  CREATININE 0.69  CALCIUM 10.0    GFR: Estimated Creatinine Clearance: 60 mL/min (by C-G formula based on SCr of 0.69 mg/dL).  Liver Function Tests: Recent Labs  Lab 11/26/20 1842  AST 17  ALT 12  ALKPHOS 63  BILITOT 0.7  PROT 8.1  ALBUMIN 4.3    Urine analysis:    Component Value Date/Time   COLORURINE AMBER (A) 11/26/2020 1654   APPEARANCEUR CLEAR 11/26/2020 1654   LABSPEC 1.030 11/26/2020 1654   PHURINE 6.0 11/26/2020 1654   GLUCOSEU NEGATIVE 11/26/2020 1654        HGBUR MODERATE (A) 11/26/2020 1654        BILIRUBINUR NEGATIVE 11/26/2020 1654        [place] 11/26/2020 1654  PROTEINUR NEGATIVE 11/26/2020 1654        NITRITE NEGATIVE 11/26/2020 [place] 11/26/2020 1654    Radiological Exams on Admission: DG Abdomen 1 View  Result Date: 11/26/2020 CLINICAL DATA:  Nasogastric tube placement. EXAM: ABDOMEN - 1 VIEW COMPARISON:  CT abdomen and pelvis 11/26/2020. FINDINGS: Nasogastric tube tip is in the body of the stomach with side hole just below the level of the gastroesophageal junction. Dilated small bowel in the upper abdomen appears unchanged. Visualized lung bases are clear. Osseous structures are within normal limits. IMPRESSION: 1. Nasogastric tube tip in the body of the stomach. 2. Dilated small bowel compatible small bowel obstruction. Electronically Signed   By: Ronney Asters M.D.   On: 11/26/2020 22:52   CT ABDOMEN PELVIS W CONTRAST  Result Date: 11/26/2020 CLINICAL DATA:  Nausea and vomiting, evaluate for bowel obstruction. EXAM: CT ABDOMEN AND PELVIS WITH CONTRAST TECHNIQUE: Multidetector CT imaging of the abdomen and pelvis was performed using the standard protocol following bolus  administration of intravenous contrast. CONTRAST:  80m OMNIPAQUE IOHEXOL 350 MG/ML SOLN COMPARISON:  CT abdomen and pelvis 09/24/2009. FINDINGS: Lower chest: No acute abnormality. Hepatobiliary: Small gallstones are present. There is no biliary ductal dilatation. The liver appears within normal limits. Pancreas: Unremarkable. No pancreatic ductal dilatation or surrounding inflammatory changes. Spleen: Normal in size without focal abnormality. Adrenals/Urinary Tract: There are likely punctate nonobstructing bilateral renal calculi. Kidneys and bladder are otherwise within normal limits. Stomach/Bowel: There are dilated fluid-filled mid and proximal small bowel loops containing air-fluid levels. Stomach is also dilated with air-fluid level. Transition point is seen along the anterior lower abdominal wall image 2/55. Small bowel loops are completely decompressed distal to this point. Colon is nondilated. Appendix is within normal limits. There is no pneumatosis or free air. There is mild mesenteric edema. Vascular/Lymphatic: Aortic atherosclerosis. No enlarged abdominal or pelvic lymph nodes. Reproductive: Calcified uterine fibroids are present which have increased in number when compared to the prior examination. Largest fibroid measures 4.3 cm. Adnexa are unremarkable. Other: There is no ascites. There is no focal abdominal wall hernia. Within the retroperitoneum at and below the level of the left kidney there is a lobulated fluid attenuation area without enhancement. This was seen on the prior examination but has increased in size when compared to the prior study. Overall measurements are approximately 2.0 x 3.8 by 5.2 cm. Musculoskeletal: No acute or significant osseous findings. IMPRESSION: 1. Findings compatible with this he small bowel obstruction. Transition point is seen along the anterior abdominal wall which may be secondary to adhesions. There is mild mesenteric edema. No free air. 2. Nonobstructing  bilateral renal calculi. 3. Fibroid uterus. 4. Cystic mass in the left retroperitoneum has increased in size compared to 2011. This is indeterminate and may represent lymphatic malformation/lymphocele. Electronically Signed   By: ARonney AstersM.D.   On: 11/26/2020 20:31    EKG: Independently reviewed.   Assessment/Plan Principal Problem:   SBO (small bowel obstruction) (HCC) Admit to MedSurg/inpatient. Keep NPO. Continue IV fluids. Continue NGT with LIS. Ondansetron as needed. Morphine 2 mg IVP every 2 hours as needed. Follow-up CBC and CMP. Consider general surgery evaluation in AM.  Active Problems:   Essential hypertension Monitor blood pressure. Hydralazine 10 mg IVP every 4 hours as needed.    GERD (gastroesophageal reflux disease) Protonix 40 mg IVP every 24 hours.     DVT prophylaxis: Lovenox SQ. Code Status:   Full code. Family Communication:   Disposition  Plan:   Patient is from:  Home.  Anticipated DC to:  Home.  Anticipated DC date:  0916/2022 or 11/29/2020.  Anticipated DC barriers: Clinical status.  Consults called:   Admission status:  Inpatient/MedSurg.  Severity of Illness:  High severity after presenting with abdominal pain, nausea, vomiting and diarrhea for 1 day in the setting of small bowel obstruction.  The patient will need to remain in the hospital for IV hydration, GI tract decompression with NGT and symptomatic treatment.  Reubin Milan MD [facility]  How to contact the [facility] Attending or Consulting provider [place] or covering provider during after hours [place], for this patient?   Check the care team in [facility] and look for a) attending/consulting [facility] provider listed and b) the [facility] team listed Log into [email or web address] and use 's universal password to access. If you do not have the password, please contact the hospital operator. Locate the [facility] provider you are looking for under [facility] and page to a number that you  can be directly reached. If you still have difficulty reaching the provider, please page the [facility] (Director on Call) for the Hospitalists listed on amion for assistance.  11/27/2020, 2:03 AM   This document was prepared using Dragon voice recognition software and may contain some unintended transcription errors.

## 2020-11-28 DIAGNOSIS — K56609 Unspecified intestinal obstruction, unspecified as to partial versus complete obstruction: Secondary | ICD-10-CM | POA: Diagnosis not present

## 2020-11-28 LAB — COMPREHENSIVE METABOLIC PANEL
ALT: 9 U/L (ref 0–44)
AST: 12 U/L — ABNORMAL LOW (ref 15–41)
Albumin: 3.4 g/dL — ABNORMAL LOW (ref 3.5–5.0)
Alkaline Phosphatase: 60 U/L (ref 38–126)
Anion gap: 6 (ref 5–15)
BUN: 11 mg/dL (ref 8–23)
CO2: 28 mmol/L (ref 22–32)
Calcium: 9.1 mg/dL (ref 8.9–10.3)
Chloride: 105 mmol/L (ref 98–111)
Creatinine, Ser: 0.67 mg/dL (ref 0.44–1.00)
GFR, Estimated: >60 mL/min (ref >60)
Glucose, Bld: 119 mg/dL — ABNORMAL HIGH (ref 70–99)
Potassium: 3.6 mmol/L (ref 3.5–5.1)
Sodium: 139 mmol/L (ref 135–145)
Total Bilirubin: 0.9 mg/dL (ref 0.3–1.2)
Total Protein: 6.9 g/dL (ref 6.5–8.1)

## 2020-11-28 LAB — MAGNESIUM: Magnesium: 2.1 mg/dL (ref 1.7–2.4)

## 2020-11-28 NOTE — Progress Notes (Signed)
Subjective: Feels well today.  Passing a lot of flatus.  No BM yet.  No pain  ROS: See above, otherwise other systems negative  Objective: Vital signs in last 24 hours: Temp:  [97.9 F (36.6 C)-99.2 F (37.3 C)] 98.4 F (36.9 C) (09/16 0604) Pulse Rate:  [71-75] 73 (09/16 0933) Resp:  [14-18] 14 (09/16 0933) BP: (140-161)/(74-93) 146/74 (09/16 0933) SpO2:  [98 %-100 %] 100 % (09/16 0933) Last BM Date: 11/26/20  Intake/Output from previous day: 09/15 0701 - 09/16 0700 In: 1891.3 [I.V.:1491.3; NG/GT:400] Out: 2000 [Urine:2000] Intake/Output this shift: No intake/output data recorded.  PE: Abd: soft, NT, Nd, +BS, NGT removed by myself at bedside  Lab Results:  Recent Labs    11/26/20 1842 11/27/20 0423  WBC 8.8 6.9  HGB 14.5 12.1  HCT 43.5 36.3  PLT 247 199   BMET Recent Labs    11/27/20 0423 11/28/20 0401  NA 135 139  K 3.7 3.6  CL 102 105  CO2 27 28  GLUCOSE 106* 119*  BUN 17 11  CREATININE 0.66 0.67  CALCIUM 9.1 9.1   PT/INR Recent Labs    11/26/20 1842  LABPROT 12.3  INR 0.9   CMP     Component Value Date/Time   NA 139 11/28/2020 0401   K 3.6 11/28/2020 0401   CL 105 11/28/2020 0401   CO2 28 11/28/2020 0401   GLUCOSE 119 (H) 11/28/2020 0401   BUN 11 11/28/2020 0401   CREATININE 0.67 11/28/2020 0401   CALCIUM 9.1 11/28/2020 0401   PROT 6.9 11/28/2020 0401   ALBUMIN 3.4 (L) 11/28/2020 0401   AST 12 (L) 11/28/2020 0401   ALT 9 11/28/2020 0401   ALKPHOS 60 11/28/2020 0401   BILITOT 0.9 11/28/2020 0401   GFRNONAA >60 11/28/2020 0401   GFRAA 98 12/23/2006 0858   Lipase     Component Value Date/Time   LIPASE 65 (H) 11/26/2020 1842       Studies/Results: DG Abdomen 1 View  Result Date: 11/26/2020 CLINICAL DATA:  Nasogastric tube placement. EXAM: ABDOMEN - 1 VIEW COMPARISON:  CT abdomen and pelvis 11/26/2020. FINDINGS: Nasogastric tube tip is in the body of the stomach with side hole just below the level of the  gastroesophageal junction. Dilated small bowel in the upper abdomen appears unchanged. Visualized lung bases are clear. Osseous structures are within normal limits. IMPRESSION: 1. Nasogastric tube tip in the body of the stomach. 2. Dilated small bowel compatible small bowel obstruction. Electronically Signed   By: Ronney Asters M.D.   On: 11/26/2020 22:52   CT ABDOMEN PELVIS W CONTRAST  Result Date: 11/26/2020 CLINICAL DATA:  Nausea and vomiting, evaluate for bowel obstruction. EXAM: CT ABDOMEN AND PELVIS WITH CONTRAST TECHNIQUE: Multidetector CT imaging of the abdomen and pelvis was performed using the standard protocol following bolus administration of intravenous contrast. CONTRAST:  6m OMNIPAQUE IOHEXOL 350 MG/ML SOLN COMPARISON:  CT abdomen and pelvis 09/24/2009. FINDINGS: Lower chest: No acute abnormality. Hepatobiliary: Small gallstones are present. There is no biliary ductal dilatation. The liver appears within normal limits. Pancreas: Unremarkable. No pancreatic ductal dilatation or surrounding inflammatory changes. Spleen: Normal in size without focal abnormality. Adrenals/Urinary Tract: There are likely punctate nonobstructing bilateral renal calculi. Kidneys and bladder are otherwise within normal limits. Stomach/Bowel: There are dilated fluid-filled mid and proximal small bowel loops containing air-fluid levels. Stomach is also dilated with air-fluid level. Transition point is seen along the anterior lower abdominal wall image 2/55.  Small bowel loops are completely decompressed distal to this point. Colon is nondilated. Appendix is within normal limits. There is no pneumatosis or free air. There is mild mesenteric edema. Vascular/Lymphatic: Aortic atherosclerosis. No enlarged abdominal or pelvic lymph nodes. Reproductive: Calcified uterine fibroids are present which have increased in number when compared to the prior examination. Largest fibroid measures 4.3 cm. Adnexa are unremarkable. Other:  There is no ascites. There is no focal abdominal wall hernia. Within the retroperitoneum at and below the level of the left kidney there is a lobulated fluid attenuation area without enhancement. This was seen on the prior examination but has increased in size when compared to the prior study. Overall measurements are approximately 2.0 x 3.8 by 5.2 cm. Musculoskeletal: No acute or significant osseous findings. IMPRESSION: 1. Findings compatible with this he small bowel obstruction. Transition point is seen along the anterior abdominal wall which may be secondary to adhesions. There is mild mesenteric edema. No free air. 2. Nonobstructing bilateral renal calculi. 3. Fibroid uterus. 4. Cystic mass in the left retroperitoneum has increased in size compared to 2011. This is indeterminate and may represent lymphatic malformation/lymphocele. Electronically Signed   By: Ronney Asters M.D.   On: 11/26/2020 20:31   DG Abd Portable 1V-Small Bowel Obstruction Protocol-initial, 8 hr delay  Result Date: 11/27/2020 CLINICAL DATA:  Small-bowel obstruction EXAM: PORTABLE ABDOMEN - 1 VIEW COMPARISON:  11/26/2020 FINDINGS: Supine frontal view of the abdomen and pelvis demonstrates enteric catheter overlying gastric body. Oral contrast is seen throughout the colon. No evidence of high-grade obstruction. No masses or abnormal calcifications. IMPRESSION: 1. Progression of oral contrast throughout the colon. No evidence of bowel obstruction. Electronically Signed   By: Randa Ngo M.D.   On: 11/27/2020 20:38    Anti-infectives: Anti-infectives (From admission, onward)    None        Assessment/Plan SBO -resolving clinically and on plain film -Dc NGT and give FLD -stable for DC home if tolerates FLD. -we discussed advancing her diet as she tolerates at home.  She is agreeable to this plan.  -Discussed plan with primary team as we saw patient together  FEN - FLD VTE - Lovenox ID - none   LOS: 1 day    Henreitta Cea , [facility] 11/28/2020, 9:43 AM Please see Amion for pager number during day hours 7:00am-4:30pm or 7:00am -11:30am on weekends

## 2020-11-28 NOTE — Discharge Summary (Signed)
Physician Discharge Summary  Angela Thornton [id number] DOB: 1955/11/08 DOA: 11/26/2020  PCP: Vira Blanco, MD  Admit date: 11/26/2020 Discharge date: 11/28/2020  Admitted From: Home Disposition: Home  Recommendations for Outpatient Follow-up:  Follow up with PCP in 1-2 weeks Please obtain BMP/CBC in one week your next doctors visit.  Advised liquid diet for the next couple of days thereafter transition to soft low fiber diet.  Slowly advance into routine diet over the next week.  Discharge Condition: Stable CODE STATUS: Full Diet recommendation: Liquid diet to soft diet/low fiber and eventually to regular diet over next week.  Brief/Interim Summary: 65 year old with history of seasonal allergy, unspecified anemia, previous hiatal hernia repair, C-section, GERD, HTN, history of small bowel obstruction comes to the hospital with complaints of diffuse abdominal pain, nausea vomiting and diarrhea.  CT of the abdomen pelvis showed small bowel obstruction with transition point in the anterior abdomen, nonobstructive bilateral renal stones, fibroid uterus, cystic mass to the left retroperitoneum increase in size compared to 2011 Patient did well with conservative management and eventually her NG tube was discontinued at discharge home with instructions as given above.   Assessment & Plan:   Principal Problem:   SBO (small bowel obstruction) (HCC) Active Problems:   Essential hypertension   GERD (gastroesophageal reflux disease)   Small bowel obstruction - Resolved with conservative management.  Appreciate input by general surgery, NG tube removed.  If tolerates oral today she can be discharged.   Bilateral nonobstructive renal stone -Oral fluids.    Asymptomatic bacteriuria - no need for Abx   Essential hypertension - Resume home meds    GERD - PPI daily    Body mass index is 26.69 kg/m.         Discharge Diagnoses:  Principal Problem:   SBO (small  bowel obstruction) (HCC) Active Problems:   Essential hypertension   GERD (gastroesophageal reflux disease)      Consultations: General surgery  Subjective: Feels great, seen patient with general surgery team.  NG tube removed walker in the room, abdomen is soft no complaints.  Discharge Exam: Vitals:   11/28/20 0604 11/28/20 0933  BP: 140/86 (!) 146/74  Pulse: 72 73  Resp: 16 14  Temp: 98.4 F (36.9 C)   SpO2: 100% 100%   Vitals:   11/27/20 1814 11/27/20 2100 11/28/20 0604 11/28/20 0933  BP: (!) 161/76 (!) 153/93 140/86 (!) 146/74  Pulse: 71 75 72 73  Resp: '16 18 16 14  '$ Temp: 98.9 F (37.2 C) 99.2 F (37.3 C) 98.4 F (36.9 C)   TempSrc: Oral Oral Oral   SpO2: 100% 100% 100% 100%  Weight:      Height:        General: Pt is alert, awake, not in acute distress Cardiovascular: RRR, S1/S2 +, no rubs, no gallops Respiratory: CTA bilaterally, no wheezing, no rhonchi Abdominal: Soft, NT, ND, bowel sounds + Extremities: no edema, no cyanosis  Discharge Instructions   Allergies as of 11/28/2020       Reactions   Ciprofloxacin    REACTION: tingling in hands, dizzy, tingling in feet   Sulfa Antibiotics    Other reaction(s): Other (See Comments), Other (See Comments) Other reaction(s):  numbness numbness        Medication List     STOP taking these medications    esomeprazole 40 MG capsule Commonly known as: NexIUM   fluticasone 50 MCG/ACT nasal spray Commonly known as: FLONASE   SLOW RELEASE IRON PO  TAKE these medications    lisinopril-hydrochlorothiazide 20-25 MG tablet Commonly known as: ZESTORETIC TAKE 1 TABLET BY MOUTH EVERY DAY ( DOCTOR WOULD LIKE TO SEE YOU FOR OFFICE VISIT)   multivitamin with minerals tablet Take 1 tablet by mouth daily.   PARoxetine 20 MG tablet Commonly known as: PAXIL Take 20 mg by mouth daily.        Allergies  Allergen Reactions   Ciprofloxacin     REACTION: tingling in hands, dizzy, tingling in  feet   Sulfa Antibiotics     Other reaction(s): Other (See Comments), Other (See Comments) Other reaction(s):  numbness numbness     You were cared for by a hospitalist during your hospital stay. If you have any questions about your discharge medications or the care you received while you were in the hospital after you are discharged, you can call the unit and asked to speak with the hospitalist on call if the hospitalist that took care of you is not available. Once you are discharged, your primary care physician will handle any further medical issues. Please note that no refills for any discharge medications will be authorized once you are discharged, as it is imperative that you return to your primary care physician (or establish a relationship with a primary care physician if you do not have one) for your aftercare needs so that they can reassess your need for medications and monitor your lab values.   Procedures/Studies: DG Abdomen 1 View  Result Date: 11/26/2020 CLINICAL DATA:  Nasogastric tube placement. EXAM: ABDOMEN - 1 VIEW COMPARISON:  CT abdomen and pelvis 11/26/2020. FINDINGS: Nasogastric tube tip is in the body of the stomach with side hole just below the level of the gastroesophageal junction. Dilated small bowel in the upper abdomen appears unchanged. Visualized lung bases are clear. Osseous structures are within normal limits. IMPRESSION: 1. Nasogastric tube tip in the body of the stomach. 2. Dilated small bowel compatible small bowel obstruction. Electronically Signed   By: Ronney Asters M.D.   On: 11/26/2020 22:52   CT ABDOMEN PELVIS W CONTRAST  Result Date: 11/26/2020 CLINICAL DATA:  Nausea and vomiting, evaluate for bowel obstruction. EXAM: CT ABDOMEN AND PELVIS WITH CONTRAST TECHNIQUE: Multidetector CT imaging of the abdomen and pelvis was performed using the standard protocol following bolus administration of intravenous contrast. CONTRAST:  50m OMNIPAQUE IOHEXOL 350 MG/ML  SOLN COMPARISON:  CT abdomen and pelvis 09/24/2009. FINDINGS: Lower chest: No acute abnormality. Hepatobiliary: Small gallstones are present. There is no biliary ductal dilatation. The liver appears within normal limits. Pancreas: Unremarkable. No pancreatic ductal dilatation or surrounding inflammatory changes. Spleen: Normal in size without focal abnormality. Adrenals/Urinary Tract: There are likely punctate nonobstructing bilateral renal calculi. Kidneys and bladder are otherwise within normal limits. Stomach/Bowel: There are dilated fluid-filled mid and proximal small bowel loops containing air-fluid levels. Stomach is also dilated with air-fluid level. Transition point is seen along the anterior lower abdominal wall image 2/55. Small bowel loops are completely decompressed distal to this point. Colon is nondilated. Appendix is within normal limits. There is no pneumatosis or free air. There is mild mesenteric edema. Vascular/Lymphatic: Aortic atherosclerosis. No enlarged abdominal or pelvic lymph nodes. Reproductive: Calcified uterine fibroids are present which have increased in number when compared to the prior examination. Largest fibroid measures 4.3 cm. Adnexa are unremarkable. Other: There is no ascites. There is no focal abdominal wall hernia. Within the retroperitoneum at and below the level of the left kidney there is a lobulated fluid  attenuation area without enhancement. This was seen on the prior examination but has increased in size when compared to the prior study. Overall measurements are approximately 2.0 x 3.8 by 5.2 cm. Musculoskeletal: No acute or significant osseous findings. IMPRESSION: 1. Findings compatible with this he small bowel obstruction. Transition point is seen along the anterior abdominal wall which may be secondary to adhesions. There is mild mesenteric edema. No free air. 2. Nonobstructing bilateral renal calculi. 3. Fibroid uterus. 4. Cystic mass in the left retroperitoneum has  increased in size compared to 2011. This is indeterminate and may represent lymphatic malformation/lymphocele. Electronically Signed   By: Ronney Asters M.D.   On: 11/26/2020 20:31   DG Abd Portable 1V-Small Bowel Obstruction Protocol-initial, 8 hr delay  Result Date: 11/27/2020 CLINICAL DATA:  Small-bowel obstruction EXAM: PORTABLE ABDOMEN - 1 VIEW COMPARISON:  11/26/2020 FINDINGS: Supine frontal view of the abdomen and pelvis demonstrates enteric catheter overlying gastric body. Oral contrast is seen throughout the colon. No evidence of high-grade obstruction. No masses or abnormal calcifications. IMPRESSION: 1. Progression of oral contrast throughout the colon. No evidence of bowel obstruction. Electronically Signed   By: Randa Ngo M.D.   On: 11/27/2020 20:38     The results of significant diagnostics from this hospitalization (including imaging, microbiology, ancillary and laboratory) are listed below for reference.     Microbiology: Recent Results (from the past 240 hour(s))  Resp Panel by RT-PCR (Flu A&B, Covid) Nasopharyngeal Swab     Status: None   Collection Time: 11/26/20 10:00 PM   Specimen: Nasopharyngeal Swab; Nasopharyngeal(NP) swabs in vial transport medium  Result Value Ref Range Status   SARS Coronavirus 2 by RT PCR NEGATIVE NEGATIVE Final    Comment: (NOTE) SARS-CoV-2 target nucleic acids are NOT DETECTED.  The SARS-CoV-2 RNA is generally detectable in upper respiratory specimens during the acute phase of infection. The lowest concentration of SARS-CoV-2 viral copies this assay can detect is 138 copies/mL. A negative result does not preclude SARS-Cov-2 infection and should not be used as the sole basis for treatment or other patient management decisions. A negative result may occur with  improper specimen collection/handling, submission of specimen other than nasopharyngeal swab, presence of viral mutation(s) within the areas targeted by this assay, and inadequate  number of viral copies(<138 copies/mL). A negative result must be combined with clinical observations, patient history, and epidemiological information. The expected result is Negative.  Fact Sheet for Patients:  EntrepreneurPulse.com.au  Fact Sheet for Healthcare Providers:  IncredibleEmployment.be  This test is no t yet approved or cleared by the [place] FDA and  has been authorized for detection and/or diagnosis of SARS-CoV-2 by FDA under an Emergency Use Authorization (EUA). This EUA will remain  in effect (meaning this test can be used) for the duration of the COVID-19 declaration under Section 564(b)(1) of the Act, 21 U.S.C.section 360bbb-3(b)(1), unless the authorization is terminated  or revoked sooner.       Influenza A by PCR NEGATIVE NEGATIVE Final   Influenza B by PCR NEGATIVE NEGATIVE Final    Comment: (NOTE) The Xpert Xpress SARS-CoV-2/FLU/RSV plus assay is intended as an aid in the diagnosis of influenza from Nasopharyngeal swab specimens and should not be used as a sole basis for treatment. Nasal washings and aspirates are unacceptable for Xpert Xpress SARS-CoV-2/FLU/RSV testing.  Fact Sheet for Patients: EntrepreneurPulse.com.au  Fact Sheet for Healthcare Providers: IncredibleEmployment.be  This test is not yet approved or cleared by the [place] FDA and has  been authorized for detection and/or diagnosis of SARS-CoV-2 by FDA under an Emergency Use Authorization (EUA). This EUA will remain in effect (meaning this test can be used) for the duration of the COVID-19 declaration under Section 564(b)(1) of the Act, 21 U.S.C. section 360bbb-3(b)(1), unless the authorization is terminated or revoked.  Performed at [facility], [address], [place], [place] [address]   Surgical PCR screen     Status: None   Collection Time: 11/27/20  2:14 AM   Specimen: Nasal  Mucosa; Nasal Swab  Result Value Ref Range Status   MRSA, PCR NEGATIVE NEGATIVE Final   Staphylococcus aureus NEGATIVE NEGATIVE Final    Comment: (NOTE) The Xpert SA Assay (FDA approved for NASAL specimens in patients 14 years of age and older), is one component of a comprehensive surveillance program. It is not intended to diagnose infection nor to guide or monitor treatment. Performed at [facility], [place] [address], [place], [place] [address]      Labs: BNP (last 3 results) No results for input(s): BNP in the last 8760 hours. Basic Metabolic Panel: Recent Labs  Lab 11/26/20 1842 11/27/20 0423 11/28/20 0401  NA 137 135 139  K 4.1 3.7 3.6  CL 99 102 105  CO2 '29 27 28  '$ GLUCOSE 119* 106* 119*  BUN '21 17 11  '$ CREATININE 0.69 0.66 0.67  CALCIUM 10.0 9.1 9.1  MG  --  2.0 2.1   Liver Function Tests: Recent Labs  Lab 11/26/20 1842 11/28/20 0401  AST 17 12*  ALT 12 9  ALKPHOS 63 60  BILITOT 0.7 0.9  PROT 8.1 6.9  ALBUMIN 4.3 3.4*   Recent Labs  Lab 11/26/20 1842  LIPASE 65*   No results for input(s): AMMONIA in the last 168 hours. CBC: Recent Labs  Lab 11/26/20 1842 11/27/20 0423  WBC 8.8 6.9  NEUTROABS 6.8  --   HGB 14.5 12.1  HCT 43.5 36.3  MCV 90.6 89.9  PLT 247 199   Cardiac Enzymes: No results for input(s): CKTOTAL, CKMB, CKMBINDEX, TROPONINI in the last 168 hours. BNP: Invalid input(s): POCBNP CBG: Recent Labs  Lab 11/26/20 1812  GLUCAP 87   D-Dimer No results for input(s): DDIMER in the last 72 hours. Hgb A1c No results for input(s): HGBA1C in the last 72 hours. Lipid Profile No results for input(s): CHOL, HDL, LDLCALC, TRIG, CHOLHDL, LDLDIRECT in the last 72 hours. Thyroid function studies No results for input(s): TSH, T4TOTAL, T3FREE, THYROIDAB in the last 72 hours.  Invalid input(s): FREET3 Anemia work up No results for input(s): VITAMINB12, FOLATE, FERRITIN, TIBC, IRON, RETICCTPCT in the last 72  hours. Urinalysis    Component Value Date/Time   COLORURINE AMBER (A) 11/26/2020 1654   APPEARANCEUR CLEAR 11/26/2020 1654   LABSPEC 1.030 11/26/2020 1654   PHURINE 6.0 11/26/2020 1654   GLUCOSEU NEGATIVE 11/26/2020 1654   GLUCOSEU NEGATIVE 05/13/2011 0946   HGBUR MODERATE (A) 11/26/2020 1654   HGBUR negative 03/25/2010 0756   BILIRUBINUR NEGATIVE 11/26/2020 1654   BILIRUBINUR neg 05/07/2011 1020   KETONESUR NEGATIVE 11/26/2020 1654   PROTEINUR NEGATIVE 11/26/2020 1654   UROBILINOGEN 0.2 05/13/2011 0946   NITRITE NEGATIVE 11/26/2020 1654   LEUKOCYTESUR NEGATIVE 11/26/2020 1654   Sepsis Labs Invalid input(s): PROCALCITONIN,  WBC,  LACTICIDVEN Microbiology Recent Results (from the past 240 hour(s))  Resp Panel by RT-PCR (Flu A&B, Covid) Nasopharyngeal Swab     Status: None   Collection Time: 11/26/20 10:00 PM  Specimen: Nasopharyngeal Swab; Nasopharyngeal(NP) swabs in vial transport medium  Result Value Ref Range Status   SARS Coronavirus 2 by RT PCR NEGATIVE NEGATIVE Final    Comment: (NOTE) SARS-CoV-2 target nucleic acids are NOT DETECTED.  The SARS-CoV-2 RNA is generally detectable in upper respiratory specimens during the acute phase of infection. The lowest concentration of SARS-CoV-2 viral copies this assay can detect is 138 copies/mL. A negative result does not preclude SARS-Cov-2 infection and should not be used as the sole basis for treatment or other patient management decisions. A negative result may occur with  improper specimen collection/handling, submission of specimen other than nasopharyngeal swab, presence of viral mutation(s) within the areas targeted by this assay, and inadequate number of viral copies(<138 copies/mL). A negative result must be combined with clinical observations, patient history, and epidemiological information. The expected result is Negative.  Fact Sheet for Patients:  EntrepreneurPulse.com.au  Fact Sheet for  Healthcare Providers:  IncredibleEmployment.be  This test is no t yet approved or cleared by the [place] FDA and  has been authorized for detection and/or diagnosis of SARS-CoV-2 by FDA under an Emergency Use Authorization (EUA). This EUA will remain  in effect (meaning this test can be used) for the duration of the COVID-19 declaration under Section 564(b)(1) of the Act, 21 U.S.C.section 360bbb-3(b)(1), unless the authorization is terminated  or revoked sooner.       Influenza A by PCR NEGATIVE NEGATIVE Final   Influenza B by PCR NEGATIVE NEGATIVE Final    Comment: (NOTE) The Xpert Xpress SARS-CoV-2/FLU/RSV plus assay is intended as an aid in the diagnosis of influenza from Nasopharyngeal swab specimens and should not be used as a sole basis for treatment. Nasal washings and aspirates are unacceptable for Xpert Xpress SARS-CoV-2/FLU/RSV testing.  Fact Sheet for Patients: EntrepreneurPulse.com.au  Fact Sheet for Healthcare Providers: IncredibleEmployment.be  This test is not yet approved or cleared by the [place] FDA and has been authorized for detection and/or diagnosis of SARS-CoV-2 by FDA under an Emergency Use Authorization (EUA). This EUA will remain in effect (meaning this test can be used) for the duration of the COVID-19 declaration under Section 564(b)(1) of the Act, 21 U.S.C. section 360bbb-3(b)(1), unless the authorization is terminated or revoked.  Performed at [facility], [address] [place], [place] [address]   Surgical PCR screen     Status: None   Collection Time: 11/27/20  2:14 AM   Specimen: Nasal Mucosa; Nasal Swab  Result Value Ref Range Status   MRSA, PCR NEGATIVE NEGATIVE Final   Staphylococcus aureus NEGATIVE NEGATIVE Final    Comment: (NOTE) The Xpert SA Assay (FDA approved for NASAL specimens in patients 14 years of age and older), is one component of a  comprehensive surveillance program. It is not intended to diagnose infection nor to guide or monitor treatment. Performed at [facility], [place] [address] [place], [place] [address]      Time coordinating discharge:  I have spent 35 minutes face to face with the patient and on the ward discussing the patients care, assessment, plan and disposition with other care givers. >50% of the time was devoted counseling the patient about the risks and benefits of treatment/Discharge disposition and coordinating care.   SIGNED:   Damita Lack, MD  [facility] 11/28/2020, 11:35 AM   If 7PM-7AM, please contact night-coverage

## 2020-11-28 NOTE — Progress Notes (Signed)
Discharge instructions discussed with patient, verbalized agreement and understanding 

## 2023-05-31 IMAGING — DX DG ABD PORTABLE 1V
1 series · 1 of 1 positions shown · non-contrast
Comparison: 11/26/2020

CLINICAL DATA: Small-bowel obstruction

EXAM:
PORTABLE ABDOMEN - 1 VIEW

[abdomen kub]
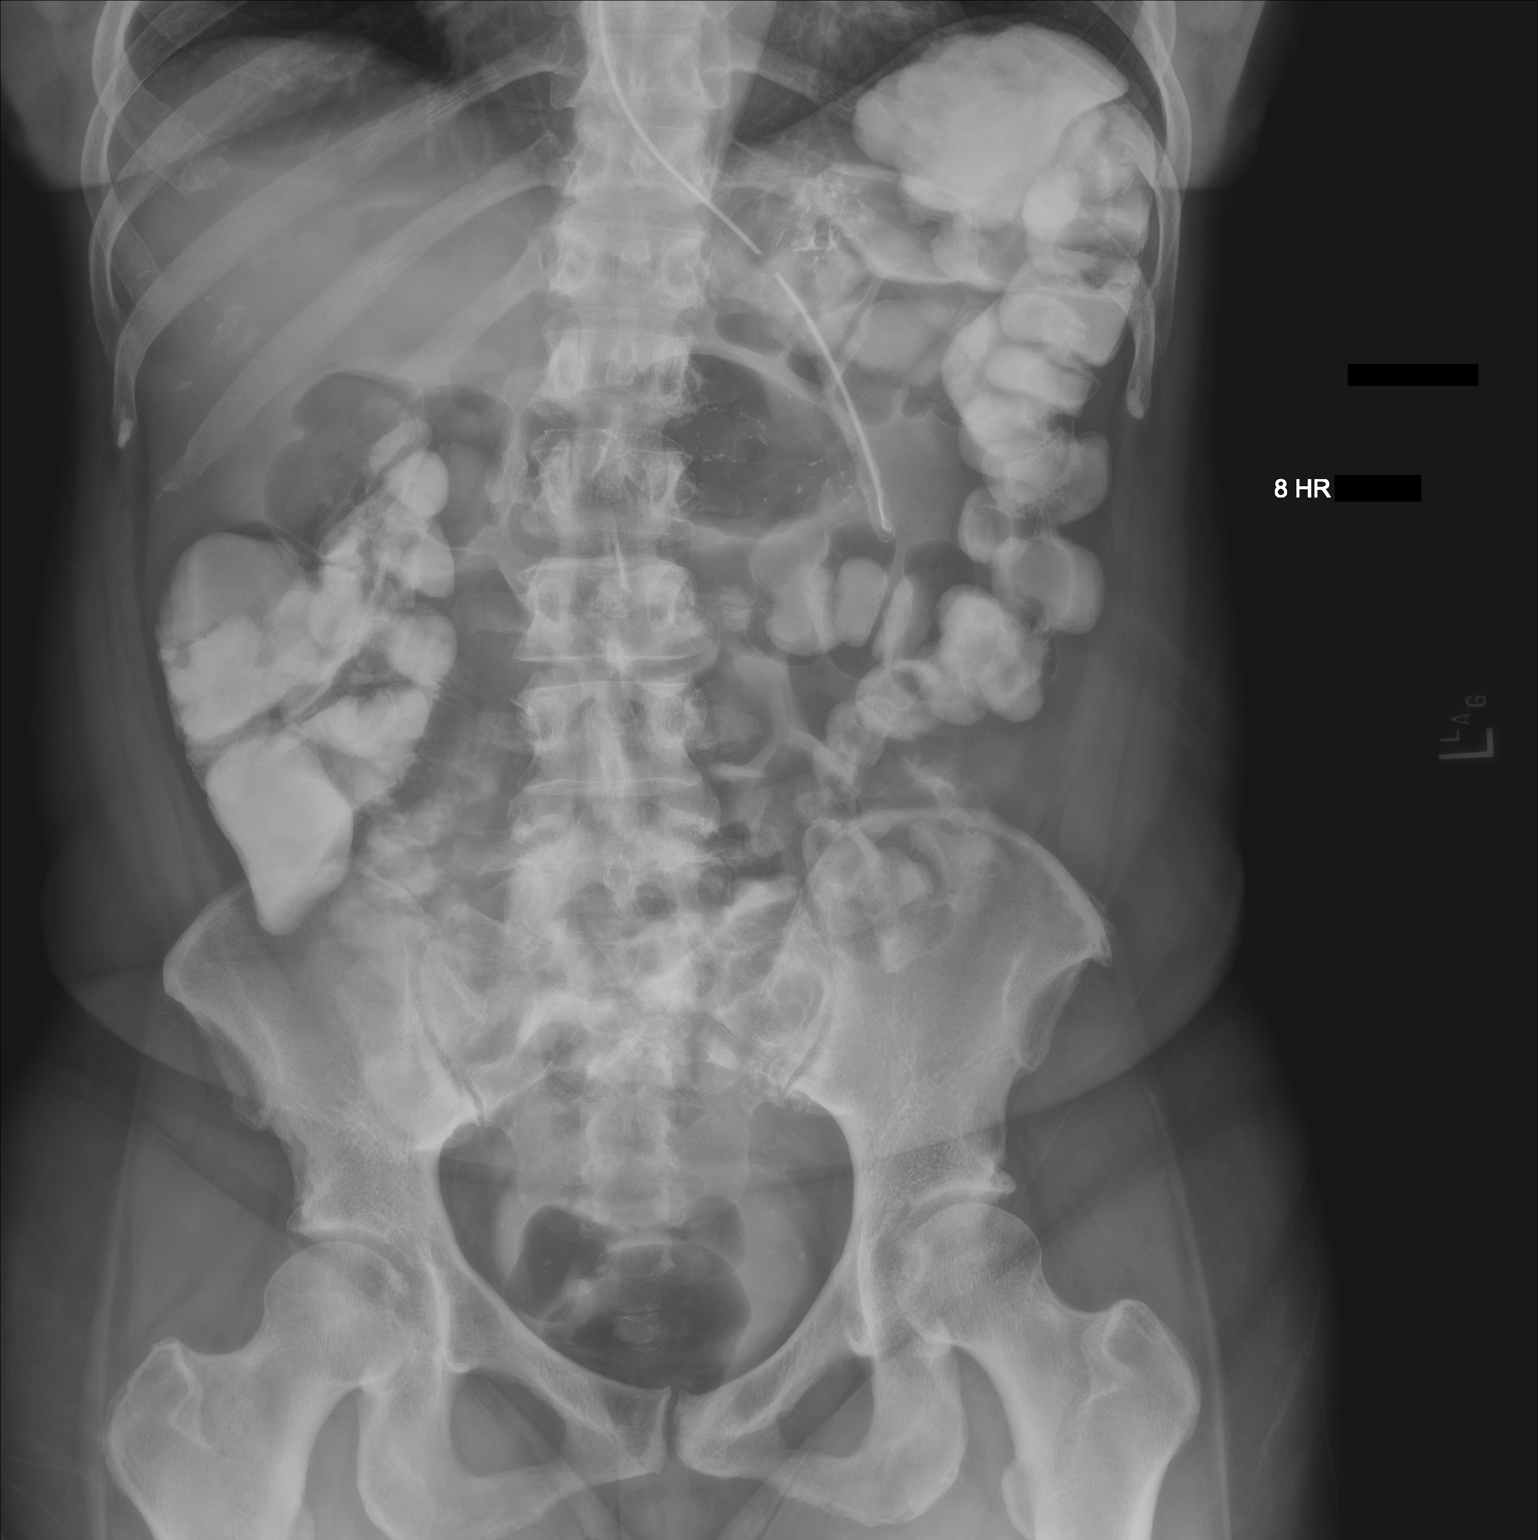

[1 of 1 positions shown; findings below may reference images not displayed]

FINDINGS: Supine frontal view of the abdomen and pelvis demonstrates enteric
catheter overlying gastric body. Oral contrast is seen throughout
the colon. No evidence of high-grade obstruction. No masses or
abnormal calcifications.
IMPRESSION: 1. Progression of oral contrast throughout the colon. No evidence of
bowel obstruction.
